# Patient Record
Sex: Female | Born: 1980 | Hispanic: Yes | Marital: Single | Smoking: Former smoker
Health system: Southern US, Community
[De-identification: ages and names within clinical notes are randomized; demographics above are authoritative.]

## PROBLEM LIST (undated history)

## (undated) ENCOUNTER — Inpatient Hospital Stay (HOSPITAL_COMMUNITY): Payer: Self-pay

## (undated) DIAGNOSIS — C9591 Leukemia, unspecified, in remission: Secondary | ICD-10-CM

## (undated) DIAGNOSIS — C801 Malignant (primary) neoplasm, unspecified: Secondary | ICD-10-CM

## (undated) DIAGNOSIS — K8020 Calculus of gallbladder without cholecystitis without obstruction: Secondary | ICD-10-CM

## (undated) DIAGNOSIS — N750 Cyst of Bartholin's gland: Secondary | ICD-10-CM

## (undated) HISTORY — PX: INCISION AND DRAINAGE: SHX5863

## (undated) HISTORY — DX: Leukemia, unspecified, in remission: C95.91

## (undated) HISTORY — PX: CHOLECYSTECTOMY: SHX55

## (undated) HISTORY — DX: Malignant (primary) neoplasm, unspecified: C80.1

---

## 2002-07-31 ENCOUNTER — Encounter: Payer: Self-pay | Admitting: Emergency Medicine

## 2002-07-31 ENCOUNTER — Emergency Department (HOSPITAL_COMMUNITY): Admission: EM | Admit: 2002-07-31 | Discharge: 2002-07-31 | Payer: Self-pay | Admitting: Emergency Medicine

## 2003-09-14 ENCOUNTER — Emergency Department (HOSPITAL_COMMUNITY): Admission: EM | Admit: 2003-09-14 | Discharge: 2003-09-14 | Payer: Self-pay | Admitting: Emergency Medicine

## 2005-06-09 ENCOUNTER — Emergency Department (HOSPITAL_COMMUNITY): Admission: EM | Admit: 2005-06-09 | Discharge: 2005-06-09 | Payer: Self-pay | Admitting: Emergency Medicine

## 2005-09-01 ENCOUNTER — Inpatient Hospital Stay (HOSPITAL_COMMUNITY): Admission: AD | Admit: 2005-09-01 | Discharge: 2005-09-01 | Payer: Self-pay | Admitting: Obstetrics & Gynecology

## 2005-09-16 ENCOUNTER — Inpatient Hospital Stay (HOSPITAL_COMMUNITY): Admission: AD | Admit: 2005-09-16 | Discharge: 2005-09-16 | Payer: Self-pay | Admitting: Gynecology

## 2005-09-30 ENCOUNTER — Inpatient Hospital Stay (HOSPITAL_COMMUNITY): Admission: AD | Admit: 2005-09-30 | Discharge: 2005-09-30 | Payer: Self-pay | Admitting: Obstetrics & Gynecology

## 2005-10-10 ENCOUNTER — Ambulatory Visit: Payer: Self-pay | Admitting: Obstetrics and Gynecology

## 2005-10-24 ENCOUNTER — Ambulatory Visit: Payer: Self-pay | Admitting: Obstetrics & Gynecology

## 2005-10-30 ENCOUNTER — Ambulatory Visit: Payer: Self-pay | Admitting: Gynecology

## 2005-10-31 ENCOUNTER — Ambulatory Visit: Payer: Self-pay | Admitting: Obstetrics & Gynecology

## 2005-11-13 ENCOUNTER — Ambulatory Visit: Payer: Self-pay | Admitting: Gynecology

## 2005-11-14 ENCOUNTER — Ambulatory Visit: Payer: Self-pay | Admitting: Obstetrics & Gynecology

## 2005-12-07 ENCOUNTER — Inpatient Hospital Stay (HOSPITAL_COMMUNITY): Admission: AD | Admit: 2005-12-07 | Discharge: 2005-12-07 | Payer: Self-pay | Admitting: Family Medicine

## 2009-03-25 HISTORY — PX: WISDOM TOOTH EXTRACTION: SHX21

## 2012-08-03 ENCOUNTER — Inpatient Hospital Stay (HOSPITAL_COMMUNITY): Payer: Self-pay

## 2012-08-03 ENCOUNTER — Encounter (HOSPITAL_COMMUNITY): Payer: Self-pay

## 2012-08-03 ENCOUNTER — Inpatient Hospital Stay (HOSPITAL_COMMUNITY)
Admission: AD | Admit: 2012-08-03 | Discharge: 2012-08-03 | Disposition: A | Discharge status: Home / Self Care | Payer: Self-pay | Source: Ambulatory Visit | Attending: Obstetrics & Gynecology | Admitting: Obstetrics & Gynecology

## 2012-08-03 DIAGNOSIS — A499 Bacterial infection, unspecified: Secondary | ICD-10-CM

## 2012-08-03 DIAGNOSIS — N760 Acute vaginitis: Secondary | ICD-10-CM | POA: Insufficient documentation

## 2012-08-03 DIAGNOSIS — N390 Urinary tract infection, site not specified: Secondary | ICD-10-CM | POA: Insufficient documentation

## 2012-08-03 DIAGNOSIS — O2341 Unspecified infection of urinary tract in pregnancy, first trimester: Secondary | ICD-10-CM

## 2012-08-03 DIAGNOSIS — O2390 Unspecified genitourinary tract infection in pregnancy, unspecified trimester: Secondary | ICD-10-CM | POA: Insufficient documentation

## 2012-08-03 DIAGNOSIS — R109 Unspecified abdominal pain: Secondary | ICD-10-CM | POA: Insufficient documentation

## 2012-08-03 DIAGNOSIS — B9689 Other specified bacterial agents as the cause of diseases classified elsewhere: Secondary | ICD-10-CM | POA: Insufficient documentation

## 2012-08-03 LAB — URINALYSIS, ROUTINE W REFLEX MICROSCOPIC
Bilirubin Urine: NEGATIVE
Glucose, UA: NEGATIVE mg/dL
Nitrite: POSITIVE — AB
Specific Gravity, Urine: 1.02 (ref 1.005–1.030)
pH: 6.5 (ref 5.0–8.0)

## 2012-08-03 LAB — CBC
Hemoglobin: 14.2 g/dL (ref 12.0–15.0)
Platelets: 251 10*3/uL (ref 150–400)
RBC: 4.89 MIL/uL (ref 3.87–5.11)
WBC: 12.3 10*3/uL — ABNORMAL HIGH (ref 4.0–10.5)

## 2012-08-03 LAB — URINE MICROSCOPIC-ADD ON

## 2012-08-03 LAB — WET PREP, GENITAL: Yeast Wet Prep HPF POC: NONE SEEN

## 2012-08-03 LAB — HCG, QUANTITATIVE, PREGNANCY: hCG, Beta Chain, Quant, S: 14874 m[IU]/mL — ABNORMAL HIGH (ref <5)

## 2012-08-03 MED ORDER — CEPHALEXIN 500 MG PO CAPS: 500.0000 mg | ORAL_CAPSULE | Freq: Three times a day (TID) | ORAL | Status: DC

## 2012-08-03 MED ORDER — METRONIDAZOLE 500 MG PO TABS: 500.0000 mg | ORAL_TABLET | Freq: Two times a day (BID) | ORAL | Status: DC

## 2012-08-03 NOTE — MAU Note (Signed)
Patient is in to verify that everything is still okay with her baby. She states that she had a miscarriage 6 years ago, she felt the same way. She denies vaginal bleeding or pain.

## 2012-08-03 NOTE — MAU Provider Note (Signed)
History     CSN: [id number]  Arrival date and time: 08/03/12 1138   First Provider Initiated Contact with Patient 08/03/12 1428      Chief Complaint  Patient presents with  . Abdominal Pain   HPI 32 y.o. G3P0010 at [redacted]w[redacted]d by LMP with cramping x about 1 week, no bleeding.   History reviewed. No pertinent past medical history.  Past Surgical History  Procedure Laterality Date  . Cholecystectomy      History reviewed. No pertinent family history.  History  Substance Use Topics  . Smoking status: Never Smoker   . Smokeless tobacco: Not on file  . Alcohol Use: No    Allergies: Allergies not on file  No prescriptions prior to admission    Review of Systems  Constitutional: Negative.   Respiratory: Negative.   Cardiovascular: Negative.   Gastrointestinal: Negative for nausea, vomiting, abdominal pain, diarrhea and constipation.  Genitourinary: Negative for dysuria, urgency, frequency, hematuria and flank pain.       Negative for vaginal bleeding, vaginal discharge, dyspareunia, + cramping   Musculoskeletal: Negative.   Neurological: Negative.   Psychiatric/Behavioral: Negative.    Physical Exam   Blood pressure 109/65, pulse 72, temperature 97.5 F (36.4 C), temperature source Oral, resp. rate 18, height 5' 1.5" (1.562 m), weight 144 lb (65.318 kg), last menstrual period 06/27/2012.  Physical Exam  Nursing note and vitals reviewed. Constitutional: She is oriented to person, place, and time. She appears well-developed and well-nourished. No distress.  Cardiovascular: Normal rate.   Respiratory: Effort normal.  GI: Soft. There is no tenderness.  Genitourinary:    There is no rash, tenderness or lesion on the right labia. There is no rash, tenderness or lesion on the left labia. No erythema, tenderness or bleeding around the vagina. Vaginal discharge: white.  Musculoskeletal: Normal range of motion.  Neurological: She is alert and oriented to person, place, and  time.  Skin: Skin is warm and dry.  Psychiatric: She has a normal mood and affect.    MAU Course  Procedures Results for orders placed during the hospital encounter of 08/03/12 (from the past 24 hour(s))  URINALYSIS, ROUTINE W REFLEX MICROSCOPIC     Status: Abnormal   Collection Time    08/03/12 12:05 PM      Result Value Range   Color, Urine YELLOW  YELLOW   APPearance HAZY (*) CLEAR   Specific Gravity, Urine 1.020  1.005 - 1.030   pH 6.5  5.0 - 8.0   Glucose, UA NEGATIVE  NEGATIVE mg/dL   Hgb urine dipstick TRACE (*) NEGATIVE   Bilirubin Urine NEGATIVE  NEGATIVE   Ketones, ur NEGATIVE  NEGATIVE mg/dL   Protein, ur NEGATIVE  NEGATIVE mg/dL   Urobilinogen, UA 0.2  0.0 - 1.0 mg/dL   Nitrite POSITIVE (*) NEGATIVE   Leukocytes, UA TRACE (*) NEGATIVE  URINE MICROSCOPIC-ADD ON     Status: Abnormal   Collection Time    08/03/12 12:05 PM      Result Value Range   Squamous Epithelial / LPF RARE  RARE   WBC, UA 3-6  <3 WBC/hpf   RBC / HPF 0-2  <3 RBC/hpf   Bacteria, UA MANY (*) RARE  POCT PREGNANCY, URINE     Status: Abnormal   Collection Time    08/03/12 12:12 PM      Result Value Range   Preg Test, Ur POSITIVE (*) NEGATIVE  CBC     Status: Abnormal   Collection Time  08/03/12  1:00 PM      Result Value Range   WBC 12.3 (*) 4.0 - 10.5 K/uL   RBC 4.89  3.87 - 5.11 MIL/uL   Hemoglobin 14.2  12.0 - 15.0 g/dL   HCT 16.1  09.6 - 04.5 %   MCV 85.1  78.0 - 100.0 fL   MCH 29.0  26.0 - 34.0 pg   MCHC 34.1  30.0 - 36.0 g/dL   RDW 40.9  81.1 - 91.4 %   Platelets 251  150 - 400 K/uL  HCG, QUANTITATIVE, PREGNANCY     Status: Abnormal   Collection Time    08/03/12  1:06 PM      Result Value Range   hCG, Beta Chain, Quant, S 78295 (*) <5 mIU/mL  ABO/RH     Status: None   Collection Time    08/03/12  1:09 PM      Result Value Range   ABO/RH(D) O POS     U/S: 5.2 week IUGS with yolk sac, no fetal pole seen yet, small subchorionic hemorrhage  Assessment and Plan   1.  Bacterial vaginosis   2. UTI in pregnancy, antepartum, first trimester   Meds prescribed as below, start care as soon as possible, pregnancy verification and list of providers given    Medication List    TAKE these medications       cephALEXin 500 MG capsule  Commonly known as:  KEFLEX  Take 1 capsule (500 mg total) by mouth 3 (three) times daily.     metroNIDAZOLE 500 MG tablet  Commonly known as:  FLAGYL  Take 1 tablet (500 mg total) by mouth 2 (two) times daily.            Follow-up Information   Follow up with provider of your choice. (start prenatal care as soon as possible)         FRAZIER,NATALIE 08/03/2012, 2:29 PM

## 2012-08-03 NOTE — MAU Note (Signed)
Has been having cramping, knows  She knows she is about 6 wks preg.  Has hx of SAB/ectopic; worried about it.

## 2012-08-05 LAB — URINE CULTURE

## 2012-08-05 LAB — GC/CHLAMYDIA PROBE AMP
CT Probe RNA: NEGATIVE
GC Probe RNA: NEGATIVE

## 2012-08-05 NOTE — MAU Provider Note (Signed)
Attestation of Attending Supervision of Advanced Practitioner (PA/CNM/NP): Evaluation and management procedures were performed by the Advanced Practitioner under my supervision and collaboration.  I have reviewed the Advanced Practitioner's note and chart, and I agree with the management and plan.  Jannelly Bergren, MD, FACOG Attending Obstetrician & Gynecologist Faculty Practice, [facility]  

## 2012-08-21 ENCOUNTER — Inpatient Hospital Stay (HOSPITAL_COMMUNITY)
Admission: AD | Admit: 2012-08-21 | Discharge: 2012-08-21 | Disposition: A | Discharge status: Home / Self Care | Payer: Self-pay | Source: Ambulatory Visit | Attending: Obstetrics & Gynecology | Admitting: Obstetrics & Gynecology

## 2012-08-21 ENCOUNTER — Encounter (HOSPITAL_COMMUNITY): Payer: Self-pay

## 2012-08-21 DIAGNOSIS — B3731 Acute candidiasis of vulva and vagina: Secondary | ICD-10-CM | POA: Insufficient documentation

## 2012-08-21 DIAGNOSIS — B373 Candidiasis of vulva and vagina: Secondary | ICD-10-CM | POA: Insufficient documentation

## 2012-08-21 DIAGNOSIS — O2390 Unspecified genitourinary tract infection in pregnancy, unspecified trimester: Secondary | ICD-10-CM | POA: Insufficient documentation

## 2012-08-21 DIAGNOSIS — N750 Cyst of Bartholin's gland: Secondary | ICD-10-CM | POA: Insufficient documentation

## 2012-08-21 HISTORY — DX: Calculus of gallbladder without cholecystitis without obstruction: K80.20

## 2012-08-21 HISTORY — DX: Cyst of Bartholin's gland: N75.0

## 2012-08-21 MED ORDER — OXYCODONE-ACETAMINOPHEN 5-325 MG PO TABS: 1.0000 | ORAL_TABLET | ORAL | Status: DC | PRN

## 2012-08-21 MED ORDER — FLUCONAZOLE 150 MG PO TABS
150.0000 mg | ORAL_TABLET | Freq: Once | ORAL | Status: AC
  Administered 2012-08-21: 150 mg via ORAL
  Filled 2012-08-21: qty 1

## 2012-08-21 NOTE — MAU Provider Note (Signed)
Chief Complaint: Bartholin's Cyst   First Provider Initiated Contact with Patient 08/21/12 1306     SUBJECTIVE HPI: Kelly [name] is a 32 y.o. G2P0010 at [redacted]w[redacted]d by LMP who presents with painful Bartholin's cyst which was diagnosed here 3 wks ago. Has gotten larger and has been painful for 2 days. Not draining. Similar episode 6 years ago required I&D and placement of catheter. Also has pruritic vulvo-vaginal white discharge and used Monistat last night.  Has NOB appointment with Dr. Gaynell Face 09/03/12. No further cramping. No vaginal bleeding.   Past Medical History  Diagnosis Date  . Bartholin cyst   . Gallstones    OB History   Grav Para Term Preterm Abortions TAB SAB Ect Mult Living   2    1  1         # Outc Date GA Lbr Len/2nd Wgt Sex Del Anes PTL Lv   1 SAB            2 CUR              Past Surgical History  Procedure Laterality Date  . Cholecystectomy     History   Social History  . Marital Status: Single    Spouse Name: N/A    Number of Children: N/A  . Years of Education: N/A   Occupational History  . Not on file.   Social History Main Topics  . Smoking status: Former Games developer  . Smokeless tobacco: Not on file  . Alcohol Use: No  . Drug Use: No  . Sexually Active: Yes   Other Topics Concern  . Not on file   Social History Narrative  . No narrative on file   No current facility-administered medications on file prior to encounter.   Current Outpatient Prescriptions on File Prior to Encounter  Medication Sig Dispense Refill  . cephALEXin (KEFLEX) 500 MG capsule Take 1 capsule (500 mg total) by mouth 3 (three) times daily.  21 capsule  0  . metroNIDAZOLE (FLAGYL) 500 MG tablet Take 1 tablet (500 mg total) by mouth 2 (two) times daily.  14 tablet  0   Allergies  Allergen Reactions  . Other     Medications that causes drowsiness causes tingling    ROS: Pertinent items in HPI  OBJECTIVE Blood pressure 117/68, pulse 84, temperature 98.4 F (36.9 C),  temperature source Oral, resp. rate 18, last menstrual period 06/27/2012. GENERAL: Well-developed, well-nourished female in no acute distress.  HEENT: Normocephalic HEART: normal rate RESP: normal effort ABDOMEN: Soft, non-tender EXTREMITIES: Nontender, no edema NEURO: Alert and oriented Ext genitalia: Left vulvar abcess 3.5 cm, very tender and somewhat fluctuant Vagina: Monistat cream evident  MAU COURSE Diflucan 150 mg given  Bartholin Cyst I&D and Ward Catheter Placement Enlarged abscess palpated in front of the hymenal ring around 5 or 7 o' clock.  Written informed consent was obtained.  Discussed complications and possible outcomes of procedure including recurrence of cyst, scarring leading to infecton, bleeding, dyspareunia, distortion of anatomy.  Patient was examined in the dorsal lithotomy position and mass was identified.  The area was prepped with Iodine and draped in a sterile manner. 1% Lidocaine (3 ml) was then used to infiltrate area on top of the cyst, behind the hymenal ring.  A 7 mm incision was made using a sterile scapel. Upon palpation of the mass, a small amount of bloody purulent drainage was expressed through the incision. A hemostat was used to attempt to break up loculations, but none  found and only bloody discharge. Did not place Word catheter (depth insufficieint). The open cyst was then irrigated with normal saline.  Patient tolerated the procedure well, reported feeling better.  - Recommended Sitz baths bid and Motrin. Offerred Percocet but patient declined. She was told to call to be examined if she experiences increasing swelling, pain, vaginal discharge, or fever.  She was instructed to wear a peripad to absorb discharge, and to maintain pelvic rest while the Word catheter is in place.    ASSESSMENT 1. Infected cyst of Bartholin's gland duct   2. Yeast vaginitis   G2P0010 at [redacted]w[redacted]d IUP  PLAN Discharge home See AVS for patient education   Medication List     TAKE these medications       oxyCODONE-acetaminophen 5-325 MG per tablet  Commonly known as:  PERCOCET/ROXICET  Take 1 tablet by mouth every 4 (four) hours as needed for pain.       Follow-up Information   Follow up with Kathreen Cosier, MD On 09/03/2012.   Contact information:   [address] [place] [address] [phone]       Danae Orleans, CNM 08/21/2012  1:15 PM

## 2012-08-21 NOTE — MAU Note (Signed)
Bartholin's cyst, was told it was there a few wks ago, has gotten worse, now is hurting.  Also thinks has an infection. (itching)

## 2012-08-22 NOTE — MAU Provider Note (Signed)
Attestation of Attending Supervision of Advanced Practitioner (CNM/NP): Evaluation and management procedures were performed by the Advanced Practitioner under my supervision and collaboration.  I have reviewed the Advanced Practitioner's note and chart, and I agree with the management and plan.  HARRAWAY-SMITH, Emelynn Rance 8:41 AM     

## 2012-08-24 ENCOUNTER — Encounter (HOSPITAL_COMMUNITY): Payer: Self-pay | Admitting: *Deleted

## 2012-08-24 ENCOUNTER — Inpatient Hospital Stay (HOSPITAL_COMMUNITY)
Admission: AD | Admit: 2012-08-24 | Discharge: 2012-08-24 | Disposition: A | Discharge status: Home / Self Care | Payer: Self-pay | Source: Ambulatory Visit | Attending: Obstetrics & Gynecology | Admitting: Obstetrics & Gynecology

## 2012-08-24 DIAGNOSIS — O2390 Unspecified genitourinary tract infection in pregnancy, unspecified trimester: Secondary | ICD-10-CM | POA: Insufficient documentation

## 2012-08-24 DIAGNOSIS — N750 Cyst of Bartholin's gland: Secondary | ICD-10-CM

## 2012-08-24 MED ORDER — LIDOCAINE 5 % EX OINT
TOPICAL_OINTMENT | Freq: Once | CUTANEOUS | Status: AC
  Administered 2012-08-24: 14:00:00 via TOPICAL
  Filled 2012-08-24: qty 35.44

## 2012-08-24 MED ORDER — HYDROCODONE-ACETAMINOPHEN 5-325 MG PO TABS
1.0000 | ORAL_TABLET | Freq: Once | ORAL | Status: AC
  Administered 2012-08-24: 1 via ORAL
  Filled 2012-08-24: qty 1

## 2012-08-24 MED ORDER — OXYCODONE-ACETAMINOPHEN 5-325 MG PO TABS: 1.0000 | ORAL_TABLET | Freq: Once | ORAL | Status: DC

## 2012-08-24 MED ORDER — CLINDAMYCIN HCL 300 MG PO CAPS: 300.0000 mg | ORAL_CAPSULE | Freq: Three times a day (TID) | ORAL | Status: DC

## 2012-08-24 MED ORDER — HYDROCODONE-ACETAMINOPHEN 5-325 MG PO TABS: 1.0000 | ORAL_TABLET | Freq: Four times a day (QID) | ORAL | Status: DC | PRN

## 2012-08-24 NOTE — MAU Provider Note (Signed)
Attestation of Attending Supervision of Advanced Practitioner (CNM/NP): Evaluation and management procedures were performed by the Advanced Practitioner under my supervision and collaboration. I have reviewed the Advanced Practitioner's note and chart, and I agree with the management and plan.  LEGGETT,KELLY H. 7:04 PM

## 2012-08-24 NOTE — MAU Provider Note (Signed)
History     CSN: [id number]  Arrival date and time: 08/24/12 1251   First Provider Initiated Contact with Patient 08/24/12 1342      No chief complaint on file.  HPI 32 y.o. G2P0010 at [redacted]w[redacted]d with bartholin's cyst. I&D on Friday without much drainage, worsened on Friday night again, worsened over weekend, doing sitz baths.   Past Medical History  Diagnosis Date  . Bartholin cyst   . Gallstones     Past Surgical History  Procedure Laterality Date  . Cholecystectomy      No family history on file.  History  Substance Use Topics  . Smoking status: Former Games developer  . Smokeless tobacco: Not on file  . Alcohol Use: No    Allergies:  Allergies  Allergen Reactions  . Other     Medications that causes drowsiness causes tingling    Prescriptions prior to admission  Medication Sig Dispense Refill  . ibuprofen (ADVIL,MOTRIN) 800 MG tablet Take 800 mg by mouth every 8 (eight) hours as needed for pain.      . Prenatal Vit-Fe Fumarate-FA (PRENATAL MULTIVITAMIN) TABS Take 1 tablet by mouth daily at 12 noon.        Review of Systems  Constitutional: Negative.   Respiratory: Negative.   Cardiovascular: Negative.   Gastrointestinal: Negative for nausea, vomiting, abdominal pain, diarrhea and constipation.  Genitourinary: Negative for dysuria, urgency, frequency, hematuria and flank pain.       Negative for vaginal bleeding, vaginal discharge  Musculoskeletal: Negative.   Neurological: Negative.   Psychiatric/Behavioral: Negative.    Physical Exam   Blood pressure 104/64, pulse 84, temperature 98.2 F (36.8 C), temperature source Oral, resp. rate 18, last menstrual period 06/27/2012.  Physical Exam  Nursing note and vitals reviewed. Constitutional: She is oriented to person, place, and time. She appears well-developed and well-nourished. No distress.  Cardiovascular: Normal rate.   Respiratory: Effort normal.  Musculoskeletal: Normal range of motion.  Neurological: She is  alert and oriented to person, place, and time.  Skin: Skin is warm and dry.  Psychiatric: She has a normal mood and affect.    MAU Course  INCISION AND DRAINAGE Date/Time: 08/24/2012 4:20 PM Performed by: Archie Patten Authorized by: Archie Patten Consent: Verbal consent obtained. written consent obtained. Risks and benefits: risks, benefits and alternatives were discussed Consent given by: patient Required items: required blood products, implants, devices, and special equipment available Patient identity confirmed: verbally with patient Type: abscess Location: bartholin's gland. Local anesthetic: topical anesthetic Patient sedated: no Incision type: single straight Complexity: simple Drainage: purulent and serosanguinous Drainage amount: copious Wound treatment: wound left open (attempted to place word catheter unsuccessully) Patient tolerance: Patient tolerated the procedure well with no immediate complications.     Assessment and Plan   1. Bartholin's cyst   Sitz baths, rx clindamycin and vicodin, has appt scheduled with Dr. Gaynell Face on 6/12, precautions rev'd    Medication List    TAKE these medications       clindamycin 300 MG capsule  Commonly known as:  CLEOCIN  Take 1 capsule (300 mg total) by mouth 3 (three) times daily.     HYDROcodone-acetaminophen 5-325 MG per tablet  Commonly known as:  NORCO/VICODIN  Take 1 tablet by mouth every 6 (six) hours as needed for pain.     ibuprofen 800 MG tablet  Commonly known as:  ADVIL,MOTRIN  Take 800 mg by mouth every 8 (eight) hours as needed for pain.  prenatal multivitamin Tabs  Take 1 tablet by mouth daily at 12 noon.            Follow-up Information   Follow up with MARSHALL,BERNARD A, MD. (as scheduled)    Contact information:   [address] [place] [address] [phone]         Yanky Vanderburg 08/24/2012, 1:44 PM

## 2012-08-24 NOTE — MAU Note (Signed)
Pt reports she was seen on Friday for Barthelens cyst on left labia. I&D done but it "was not ready" Has reformed and is much more painful now. Did warm baths without much releif.

## 2012-08-24 NOTE — Progress Notes (Signed)
Moderate amount of purulent drainage out . Pt tolerated fair to well

## 2012-11-01 ENCOUNTER — Inpatient Hospital Stay (HOSPITAL_COMMUNITY)
Admission: AD | Admit: 2012-11-01 | Discharge: 2012-11-01 | Disposition: A | Discharge status: Home / Self Care | Payer: Self-pay | Source: Ambulatory Visit | Attending: Obstetrics and Gynecology | Admitting: Obstetrics and Gynecology

## 2012-11-01 ENCOUNTER — Telehealth (HOSPITAL_COMMUNITY): Payer: Self-pay | Admitting: *Deleted

## 2012-11-01 ENCOUNTER — Encounter (HOSPITAL_COMMUNITY): Payer: Self-pay | Admitting: *Deleted

## 2012-11-01 DIAGNOSIS — N750 Cyst of Bartholin's gland: Secondary | ICD-10-CM

## 2012-11-01 DIAGNOSIS — N751 Abscess of Bartholin's gland: Secondary | ICD-10-CM | POA: Insufficient documentation

## 2012-11-01 MED ORDER — CEPHALEXIN 500 MG PO CAPS: 500.0000 mg | ORAL_CAPSULE | Freq: Four times a day (QID) | ORAL | Status: DC

## 2012-11-01 MED ORDER — HYDROCODONE-ACETAMINOPHEN 5-325 MG PO TABS: 1.0000 | ORAL_TABLET | Freq: Four times a day (QID) | ORAL | Status: DC | PRN

## 2012-11-01 MED ORDER — LIDOCAINE HCL 2 % EX GEL
Freq: Once | CUTANEOUS | Status: AC
  Administered 2012-11-01: 5 via TOPICAL
  Filled 2012-11-01: qty 20

## 2012-11-01 MED ORDER — HYDROCODONE-ACETAMINOPHEN 5-325 MG PO TABS
1.0000 | ORAL_TABLET | Freq: Once | ORAL | Status: AC
  Administered 2012-11-01: 1 via ORAL
  Filled 2012-11-01: qty 1

## 2012-11-01 MED ORDER — LIDOCAINE HCL (PF) 1 % IJ SOLN
INTRAMUSCULAR | Status: AC
  Administered 2012-11-01: 30 mL
  Filled 2012-11-01: qty 30

## 2012-11-01 MED ORDER — CEPHALEXIN 500 MG PO CAPS
500.0000 mg | ORAL_CAPSULE | Freq: Once | ORAL | Status: DC
  Filled 2012-11-01: qty 1

## 2012-11-01 NOTE — Discharge Instructions (Signed)
Bartholin's Cyst and Abscess Bartholin's glands produce mucus through small openings just outside the opening of the vagina. The mucus helps with lubrication around the vagina during sexual intercourse. If the duct becomes clogged, the gland will swell and cause a bulge on the inside of the vagina. If this becomes big enough, it can be seen and felt on the outside of the vagina as well. Sometimes, the swelling will shrink away by itself. However, if the cyst becomes infected, the Bartholin's cyst fills with pus and becomes more swollen, red and painful and becomes a Bartholin's abscess. This usually requires antibiotic treatment and surgical drainage. Sometimes, with minor surgery under local anesthesia, a small tube is placed in the cyst or abscess wall. This allows continued drainage for up to 6 weeks. Minor surgery can make a new opening to replace the clogged duct and help prevent future cysts or abscess. If the abscess occurs several times, a minor operation with local anesthesia is necessary to remove the Bartholin's gland completely or to make it drain better. Cutting open the gland and suturing the edges to make the opening of the gland bigger (marsupialization) may be needed and should usually be done by your obstetrician-gyncology physician. Antibiotics are usually prescribed for this condition. Take all antibiotics as prescribed. Make sure to finish them even if you are doing better. Take warm sitz baths for 20 minutes, 3 times a day. See your caregiver for follow-up care as recommended. SEEK MEDICAL CARE IF:   You have increasing pain, swelling, or redness near the vagina.  You have vomiting or inability to tolerate medicines.  You have a fever. Bartholin's Cyst or Abscess Bartholin's glands are small glands located within the folds of skin (labia) along the sides of the lower opening of the vagina (birth canal). A cyst may develop when the duct of the gland becomes blocked. When this happens,  fluid that accumulates within the cyst can become infected. This is known as an abscess. The Bartholin gland produces a mucous fluid to lubricate the outside of the vagina during sexual intercourse. SYMPTOMS  Patients with a small cyst may not have any symptoms. Mild discomfort to severe pain depending on the size of the cyst and if it is infected (abscess). Pain, redness, and swelling around the lower opening of the vagina. Painful intercourse. Pressure in the perineal area. Swelling of the lips of the vagina (labia). The cyst or abscess can be on one side or both sides of the vagina. DIAGNOSIS  A large swelling is seen in the lower vagina area by your caregiver. Painful to touch. Redness and pain, if it is an abscess. TREATMENT  Sometimes the cyst will go away on its own. Apply warm wet compresses to the area or take hot sitz baths several times a day. An incision to drain the cyst or abscess with local anesthesia. Culture the pus, if it is an abscess. Antibiotic treatment, if it is an abscess. Cut open the gland and suture the edges to make the opening of the gland bigger (marsupialization). Remove the whole gland if the cyst or abscess returns. PREVENTION  Practice good hygiene. Clean the vaginal area with a mild soap and soft cloth when bathing. Do not rub hard in the vaginal area when bathing. Protect the crotch area with a padded cushion if you take long bike rides or ride horses. Be sure you are well lubricated when you have sexual intercourse. HOME CARE INSTRUCTIONS  If your cyst or abscess was opened,  a small piece of gauze, or a drain, may have been placed in the wound to allow drainage. Do not remove this gauze or drain unless directed by your caregiver. Wear feminine pads, not tampons, as needed for any drainage or bleeding. If antibiotics were prescribed, take them exactly as directed. Finish the entire course. Only take over-the-counter or prescription medicines for  pain, discomfort, or fever as directed by your caregiver. SEEK IMMEDIATE MEDICAL CARE IF:  You have an increase in pain, redness, swelling, or drainage. You have bleeding from the wound which results in the use of more than the number of pads suggested by your caregiver in 24 hours. You have chills. You have a fever. You develop any new problems (symptoms) or aggravation of your existing condition. MAKE SURE YOU:  Understand these instructions. Will watch your condition. Will get help right away if you are not doing well or get worse. Document Released: 03/11/2005 Document Revised: 06/03/2011 Document Reviewed: 10/28/2007 [facility] Patient Information 2014 [place], [place].   You have uncontrolled bleeding from the vagina. Document Released: 04/18/2004 Document Revised: 06/03/2011 Document Reviewed: 04/21/2009 [facility] Patient Information 2014 [place], [place].

## 2012-11-01 NOTE — MAU Note (Addendum)
Pt reports she started having symptoms 6 days ago. Cyst on left side. Has been soaking and took antibiotics (she had at home)(amoxacillian). Using lidocaine cream she got ths last time she had one as well.  Pt has had it 2 other times. Last one in MAY. Pt had miscarriage in June.

## 2012-11-01 NOTE — MAU Provider Note (Signed)
History     CSN: [id number]  Arrival date and time: 11/01/12 1248   First Provider Initiated Contact with Patient 11/01/12 1404      Chief Complaint  Patient presents with  . Bartholin's Cyst   HPI  Kelly [name] 32 y.o.non pregnant female presents to MAU with complaints of a suspected bartholin's cyst. Symptoms started 6 days ago where she felt a marble size lesion on her left labia. She tried doing warm water soaks, lidocaine jelly that was left over from her previous bartholin's cyst and has taken amoxicillin that was left over from a previous URI. The cyst is now golf ball size and the patient rates her pain an 8/10. She had a bartholin's cyst drained in May here in MAU; they attempted to insert the ward catheter however she did not tolerate it. Sh has had multiple bartholin's cyst in the past year.   OB History   Grav Para Term Preterm Abortions TAB SAB Ect Mult Living   2    1  1    0      Past Medical History  Diagnosis Date  . Bartholin cyst   . Gallstones     Past Surgical History  Procedure Laterality Date  . Cholecystectomy      History reviewed. No pertinent family history.  History  Substance Use Topics  . Smoking status: Former Games developer  . Smokeless tobacco: Not on file  . Alcohol Use: No    Allergies:  Allergies  Allergen Reactions  . Other     Medications that causes drowsiness causes tingling    Prescriptions prior to admission  Medication Sig Dispense Refill  . amoxicillin (AMOXIL) 500 MG capsule Take 500 mg by mouth every 8 (eight) hours.      Marland Kitchen ibuprofen (ADVIL,MOTRIN) 200 MG tablet Take 600 mg by mouth every 6 (six) hours as needed for pain.      Marland Kitchen OVER THE COUNTER MEDICATION Take 1 tablet by mouth daily. Over the counter all natural multi vitamin supplement.        ROS Physical Exam   Blood pressure 126/75, pulse 110, temperature 97.8 F (36.6 C), temperature source Oral, resp. rate 18, height 5' (1.524 m), last menstrual period  06/27/2012, unknown if currently breastfeeding.  Physical Exam Physical Examination: General appearance - oriented to person, place, and time, anxious and in mild to moderate distress Pelvic - VULVA: vulvar tenderness L, vulvar edema L, vulvar mass L, Prior site of I& d noted.  MAU Course  Procedures Bartholin Cyst I&D and Ward Catheter Placement  jvferguson Enlarged abscess palpated in front of the hymenal ring around 5 o' clock.  Written informed consent was obtained.  Discussed complications and possible outcomes of procedure including recurrence of cyst, scarring leading to infecton, bleeding, dyspareunia, distortion of anatomy.  Patient was examined in the dorsal lithotomy position and mass was identified.  The area was prepped with Iodine and draped in a sterile manner. 1% Lidocaine (2 ml) was then used to infiltrate area on top of the cyst, behind the hymenal ring.  A 4 mm incision was made using a sterile scapel , an L-shaped incision. Upon palpation of the mass, a moderate amount of bloody purulent drainage was expressed through the incision. A hemostat was used to break up loculations, which resulted in expression of more bloody purulent drainage.   a Word catheter was placed. 1.0 ml of sterile water was used to inflate the catheter balloon.  The end of the catheter  was tucked into the vagina.  Patient tolerated the procedure well, reported feeling " a lot better." - Keflex 500 qid x 7 days for treatment - Recommended Sitz baths bid and Motrin and Percocet was given  prn pain.   She was told to call to be examined if she experiences increasing swelling, pain, vaginal discharge, or fever.  - She was instructed to wear a peripad to absorb discharge, and to maintain pelvic rest while the Word catheter is in place.  -The catheter will be left in place for at least two to four weeks to promote formation of an epithelialized tract for permanent drainage of glandular secretions.  - She will need an  appointment in GYN Clinic , or provider of choice  in 4 weeks from now for removal of word catheter.   MDM   Assessment and Plan  Left Bartholins Abscess.  SEE procedure note  RASCH, JENNIFER IRENE 11/01/2012, 2:15 PM

## 2012-11-01 NOTE — MAU Note (Signed)
Pt. Here for bartholin cyst symptoms for 6 days and is the size of a golf ball. Pt. States it is on the left side and has been taking amoxicillin for 3 days and took last pill today at 0800. Has been using lidocaine cream she got  In May with her last cyst. Did have last one drained. Pt. Also states she had a miscarriage in May 2014.

## 2012-11-12 ENCOUNTER — Encounter: Payer: Self-pay | Admitting: Obstetrics & Gynecology

## 2012-12-02 ENCOUNTER — Encounter: Payer: Self-pay | Admitting: Obstetrics & Gynecology

## 2012-12-02 ENCOUNTER — Ambulatory Visit (INDEPENDENT_AMBULATORY_CARE_PROVIDER_SITE_OTHER): Payer: Self-pay | Admitting: Obstetrics & Gynecology

## 2012-12-02 VITALS — BP 98/67 | HR 86 | Temp 98.2°F | Resp 20 | Ht 60.0 in | Wt 151.1 lb

## 2012-12-02 DIAGNOSIS — N751 Abscess of Bartholin's gland: Secondary | ICD-10-CM

## 2012-12-02 NOTE — Progress Notes (Signed)
Pt has some vaginal itching and creamy, yellow d/c.

## 2012-12-02 NOTE — Progress Notes (Signed)
  Subjective:    Patient ID: Kelly [name], female    DOB: Mar 07, 1981, 32 y.o.   MRN: [id number]  HPI  She is here to have her Word catheter removed. It was placed in the MAU about 4 weeks ago. She denies any pain or other problems.   Review of Systems  She uses condoms for birth control.  Her pap smear is due.    Objective:   Physical Exam  Word catheter removed easily. Opening now re epithelialized. Moderate amount of greenish liquid came out after the catheter.      Assessment & Plan:  RTC 2 months for annual

## 2012-12-02 NOTE — Patient Instructions (Signed)
Bartholin's Cyst and Abscess  Bartholin's glands produce mucus through small openings just outside the opening of the vagina. The mucus helps with lubrication around the vagina during sexual intercourse. If the duct becomes clogged, the gland will swell and cause a bulge on the inside of the vagina. If this becomes big enough, it can be seen and felt on the outside of the vagina as well. Sometimes, the swelling will shrink away by itself. However, if the cyst becomes infected, the Bartholin's cyst fills with pus and becomes more swollen, red and painful and becomes a Bartholin's abscess. This usually requires antibiotic treatment and surgical drainage. Sometimes, with minor surgery under local anesthesia, a small tube is placed in the cyst or abscess wall. This allows continued drainage for up to 6 weeks. Minor surgery can make a new opening to replace the clogged duct and help prevent future cysts or abscess.  If the abscess occurs several times, a minor operation with local anesthesia is necessary to remove the Bartholin's gland completely or to make it drain better. Cutting open the gland and suturing the edges to make the opening of the gland bigger (marsupialization) may be needed and should usually be done by your obstetrician-gyncology physician. Antibiotics are usually prescribed for this condition. Take all antibiotics as prescribed. Make sure to finish them even if you are doing better. Take warm sitz baths for 20 minutes, 3 times a day. See your caregiver for follow-up care as recommended.  SEEK MEDICAL CARE IF:    You have increasing pain, swelling, or redness near the vagina.   You have vomiting or inability to tolerate medicines.   You have a fever.   You have uncontrolled bleeding from the vagina.  Document Released: 04/18/2004 Document Revised: 06/03/2011 Document Reviewed: 04/21/2009  [employer] Patient Information 2014 [employer].

## 2013-07-14 ENCOUNTER — Ambulatory Visit: Payer: Self-pay | Admitting: Family Medicine

## 2013-07-14 ENCOUNTER — Ambulatory Visit: Payer: Self-pay

## 2013-07-14 VITALS — BP 99/66 | HR 75 | Temp 98.4°F | Resp 18 | Wt 139.0 lb

## 2013-07-14 DIAGNOSIS — R1032 Left lower quadrant pain: Secondary | ICD-10-CM

## 2013-07-14 DIAGNOSIS — D696 Thrombocytopenia, unspecified: Secondary | ICD-10-CM

## 2013-07-14 DIAGNOSIS — D72829 Elevated white blood cell count, unspecified: Secondary | ICD-10-CM

## 2013-07-14 DIAGNOSIS — R21 Rash and other nonspecific skin eruption: Secondary | ICD-10-CM

## 2013-07-14 DIAGNOSIS — R109 Unspecified abdominal pain: Secondary | ICD-10-CM

## 2013-07-14 LAB — POCT CBC
Granulocyte percent: 10.1 % — AB (ref 37–80)
HCT, POC: 40 % (ref 37.7–47.9)
Hemoglobin: 13.4 g/dL (ref 12.2–16.2)
Lymph, poc: 15.2 — AB (ref 0.6–3.4)
MCH, POC: 28.3 pg (ref 27–31.2)
MCHC: 33.5 g/dL (ref 31.8–35.4)
MCV: 84.3 fL (ref 80–97)
MID (cbc): 6.7 — AB (ref 0–0.9)
POC Granulocyte: 2.5 (ref 2–6.9)
POC LYMPH PERCENT: 62.4 % — AB (ref 10–50)
POC MID %: 27.5 %M — AB (ref 0–12)
Platelet Count, POC: 33 10*3/uL — AB (ref 142–424)
RBC: 4.74 M/uL (ref 4.04–5.48)
RDW, POC: 26.2 %
WBC: 24.3 10*3/uL — AB (ref 4.6–10.2)

## 2013-07-14 LAB — POCT URINALYSIS DIPSTICK
Bilirubin, UA: NEGATIVE
Glucose, UA: NEGATIVE
Ketones, UA: NEGATIVE
Leukocytes, UA: NEGATIVE
Nitrite, UA: POSITIVE
Protein, UA: NEGATIVE
Spec Grav, UA: 1.01
Urobilinogen, UA: 2
pH, UA: 7

## 2013-07-14 LAB — POCT UA - MICROSCOPIC ONLY
Casts, Ur, LPF, POC: NEGATIVE
Crystals, Ur, HPF, POC: NEGATIVE
Mucus, UA: NEGATIVE
WBC, Ur, HPF, POC: NEGATIVE
Yeast, UA: NEGATIVE

## 2013-07-14 NOTE — Progress Notes (Signed)
Chief Complaint:  Chief Complaint  Patient presents with  . Abdominal Pain    bloating  . Emesis  . Rash    face    HPI: Kelly [name] is a 33 y.o. female who is here for  2 day history of diffuse middle and left sided abdominal pain associated with 5 episodes of yellow, nonbloody emesis . She denies CP, SOB , nausea, diarrhea.  Does have hx of constipation, last BM was yesterday.  Hx of cholecystectomy 13 years. No distension , + flatus. No hx of SBO. No new travels, No new medicines. No new meds in last 2 days. About 3-4 weeks ago, However,  she had tooth pain and sinus congestion sxs and did take  claritin and also amoxacillin about 1 month ago, the amoxacillin was left over from some other illness. Her leg rash occurred after taking the amoxacillin.  Recently She has had sick contact, her coworker who has had similar abdominal and vomiting sxs.  Denies any night sweats, or swollen glands. She has lost weight since her first illness, she has lost about 10 lbs . Denies immunocompromise, denies urinary symptoms Her stomach was really sore yesterday, ok today, she denies any emesis today. Admits to some gingival bleeding.  She started having rash on her face yesterday after she threw up, she had similar ssxs with vomiting and rash on her legs after  What sounds like viral syndrome 4 weeks ago where she ahd some nonbloody diarrhea . She last 10 lbs since everything was tastlesss but she ahs been able to eat since then.  She is a Educational psychologist. She is a smoker 2-3 cigs daily    Past Medical History  Diagnosis Date  . Bartholin cyst   . Gallstones    Past Surgical History  Procedure Laterality Date  . Cholecystectomy    . Wisdom tooth extraction  2011   History   Social History  . Marital Status: Single    Spouse Name: N/A    Number of Children: N/A  . Years of Education: N/A   Social History Main Topics  . Smoking status: Current Every Day Smoker -- 0.25 packs/day  .  Smokeless tobacco: None  . Alcohol Use: No  . Drug Use: No  . Sexual Activity: Yes    Birth Control/ Protection: Condom   Other Topics Concern  . None   Social History Narrative  . None   Family History  Problem Relation Age of Onset  . Hyperlipidemia Mother   . Hypertension Mother   . Diabetes Mother   . Diabetes Father   . Aneurysm Father     aortic  . Cholelithiasis Sister   . Cholelithiasis Sister   . Hyperlipidemia Sister    Allergies  Allergen Reactions  . Other     Medications that causes drowsiness causes tingling   Prior to Admission medications   Medication Sig Start Date End Date Taking? Authorizing Provider  HYDROcodone-acetaminophen (NORCO/VICODIN) 5-325 MG per tablet Take 1 tablet by mouth every 6 (six) hours as needed for pain. 11/01/12   Ronnald Ramp, NP     ROS: The patient denies fevers, chills, night sweats, chest pain, palpitations, wheezing, dyspnea on exertion, dysuria, hematuria, melena, numbness, weakness, or tingling.   All other systems have been reviewed and were otherwise negative with the exception of those mentioned in the HPI and as above.    PHYSICAL EXAM: Filed Vitals:   07/14/13 1246  BP: 99/66  Pulse: 75  Temp: 98.4 F (36.9 C)  Resp: 18   Filed Vitals:   07/14/13 1246  Weight: 139 lb (63.05 kg)   Body mass index is 27.15 kg/(m^2).  General: Alert, no acute distress HEENT:  Normocephalic, atraumatic, oropharynx patent. EOMI, PERRLA, no thryoidmegaly Cardiovascular:  Regular rate and rhythm, no rubs murmurs or gallops.  No Carotid bruits, radial pulse intact. No pedal edema.  Respiratory: Clear to auscultation bilaterally.  No wheezes, rales, or rhonchi.  No cyanosis, no use of accessory musculature GI: No organomegaly, abdomen is soft and middle and left abd tenderness, positive bowel sounds.  No splenomegaly. Skin: + petechial rashes. Neurologic: Facial musculature symmetric. Psychiatric: Patient is appropriate  throughout our interaction. Lymphatic: No cervical lymphadenopathy Musculoskeletal: Gait intact.   LABS: Results for orders placed in visit on 07/14/13  POCT CBC      Result Value Ref Range   WBC 24.3 (*) 4.6 - 10.2 K/uL   Lymph, poc 15.2 (*) 0.6 - 3.4   POC LYMPH PERCENT 62.4 (*) 10 - 50 %L   MID (cbc) 6.7 (*) 0 - 0.9   POC MID % 27.5 (*) 0 - 12 %M   POC Granulocyte 2.5  2 - 6.9   Granulocyte percent 10.1 (*) 37 - 80 %G   RBC 4.74  4.04 - 5.48 M/uL   Hemoglobin 13.4  12.2 - 16.2 g/dL   HCT, POC 40.0  37.7 - 47.9 %   MCV 84.3  80 - 97 fL   MCH, POC 28.3  27 - 31.2 pg   MCHC 33.5  31.8 - 35.4 g/dL   RDW, POC 26.2     Platelet Count, POC 33 (*) 142 - 424 K/uL   MPV    0 - 99.8 fL  POCT UA - MICROSCOPIC ONLY      Result Value Ref Range   WBC, Ur, HPF, POC neg     RBC, urine, microscopic 0-2     Bacteria, U Microscopic 3+     Mucus, UA neg     Epithelial cells, urine per micros 0-3     Crystals, Ur, HPF, POC neg     Casts, Ur, LPF, POC neg     Yeast, UA neg    POCT URINALYSIS DIPSTICK      Result Value Ref Range   Color, UA yellow     Clarity, UA cloudy     Glucose, UA neg     Bilirubin, UA neg     Ketones, UA neg     Spec Grav, UA 1.010     Blood, UA small     pH, UA 7.0     Protein, UA neg     Urobilinogen, UA 2.0     Nitrite, UA pos     Leukocytes, UA Negative       EKG/XRAY:   Primary read interpreted by Dr. Marin Comment at [facility]. ? Hepatomegaly and also spenomegaly   ASSESSMENT/PLAN: Encounter Diagnoses  Name Primary?  . Abdominal pain, left lower quadrant Yes  . Rash and nonspecific skin eruption   . Thrombocytopenia, unspecified   . Leukocytosis, unspecified    Kelly [name] is a pleasant woman from [place] who is here with a complaint of left sided abd pain with emesis for the last 2 days. She was found to have abnormal blood work concerning for possible:  Acute leukemia vs Idiopathic Thrombocytopenia Purpura  vs post-viral/bacterial infection CBC was repeated x  3, similar results each time.  Kelly [name] looks pretty healthy except for petechial like rash on face and also resolving one on bilateral lower legs. I explained to Kelly. Emiliano [name] was told that I do not know why her blood work is abnormal, it could be stemming from one thing or could be multifactorial, since her blood work was significantly abnormal, I would like her to go to ER for further evaluation. The concern is this might be an acute leukemia ie ALL/CLL or AML and if so she needs to be evaluated.  CMP pending, + nitrites on urine analysis and + hepatomegaly on xray.  Spoke with Dr. Juliann Mule with heme-onc and he recommended further eval at [facility] if concerning for acute leukemia of various etiology Called [facility] to notify patient is enroute via private transportation F/u prn   Gross sideeffects, risk and benefits, and alternatives of medications d/w patient. Patient is aware that all medications have potential sideeffects and we are unable to predict every sideeffect or drug-drug interaction that may occur.  Glenford Bayley, DO 07/14/2013 2:51 PM

## 2013-07-15 ENCOUNTER — Telehealth: Payer: Self-pay | Admitting: Family Medicine

## 2013-07-15 LAB — COMPREHENSIVE METABOLIC PANEL
ALT: 24 U/L (ref 0–35)
AST: 22 U/L (ref 0–37)
Alkaline Phosphatase: 68 U/L (ref 39–117)
CO2: 22 mEq/L (ref 19–32)
Sodium: 138 mEq/L (ref 135–145)
Total Bilirubin: 3.8 mg/dL — ABNORMAL HIGH (ref 0.2–1.2)
Total Protein: 7.9 g/dL (ref 6.0–8.3)

## 2013-07-15 LAB — COMPREHENSIVE METABOLIC PANEL WITH GFR
Albumin: 4.8 g/dL (ref 3.5–5.2)
BUN: 10 mg/dL (ref 6–23)
Calcium: 9.1 mg/dL (ref 8.4–10.5)
Chloride: 101 meq/L (ref 96–112)
Creat: 0.63 mg/dL (ref 0.50–1.10)
Glucose, Bld: 78 mg/dL (ref 70–99)
Potassium: 3.2 meq/L — ABNORMAL LOW (ref 3.5–5.3)

## 2013-07-15 NOTE — Telephone Encounter (Signed)
LM to call us back. I would like an update, also wanted to let her know about labs.

## 2013-07-16 DIAGNOSIS — C9201 Acute myeloblastic leukemia, in remission: Secondary | ICD-10-CM | POA: Insufficient documentation

## 2013-09-01 DIAGNOSIS — N750 Cyst of Bartholin's gland: Secondary | ICD-10-CM | POA: Insufficient documentation

## 2013-09-01 DIAGNOSIS — Z86718 Personal history of other venous thrombosis and embolism: Secondary | ICD-10-CM | POA: Insufficient documentation

## 2013-09-01 DIAGNOSIS — Z8679 Personal history of other diseases of the circulatory system: Secondary | ICD-10-CM | POA: Insufficient documentation

## 2013-11-01 DIAGNOSIS — T829XXA Unspecified complication of cardiac and vascular prosthetic device, implant and graft, initial encounter: Secondary | ICD-10-CM | POA: Insufficient documentation

## 2014-08-19 IMAGING — CR DG ABDOMEN ACUTE W/ 1V CHEST
3 series · 3 of 3 positions shown · non-contrast
Comparison: DG CHEST 2 VIEW dated 07/31/2002

CLINICAL DATA: Abdominal pain.

EXAM:
ACUTE ABDOMEN SERIES (ABDOMEN 2 VIEW & CHEST 1 VIEW)

[PA]
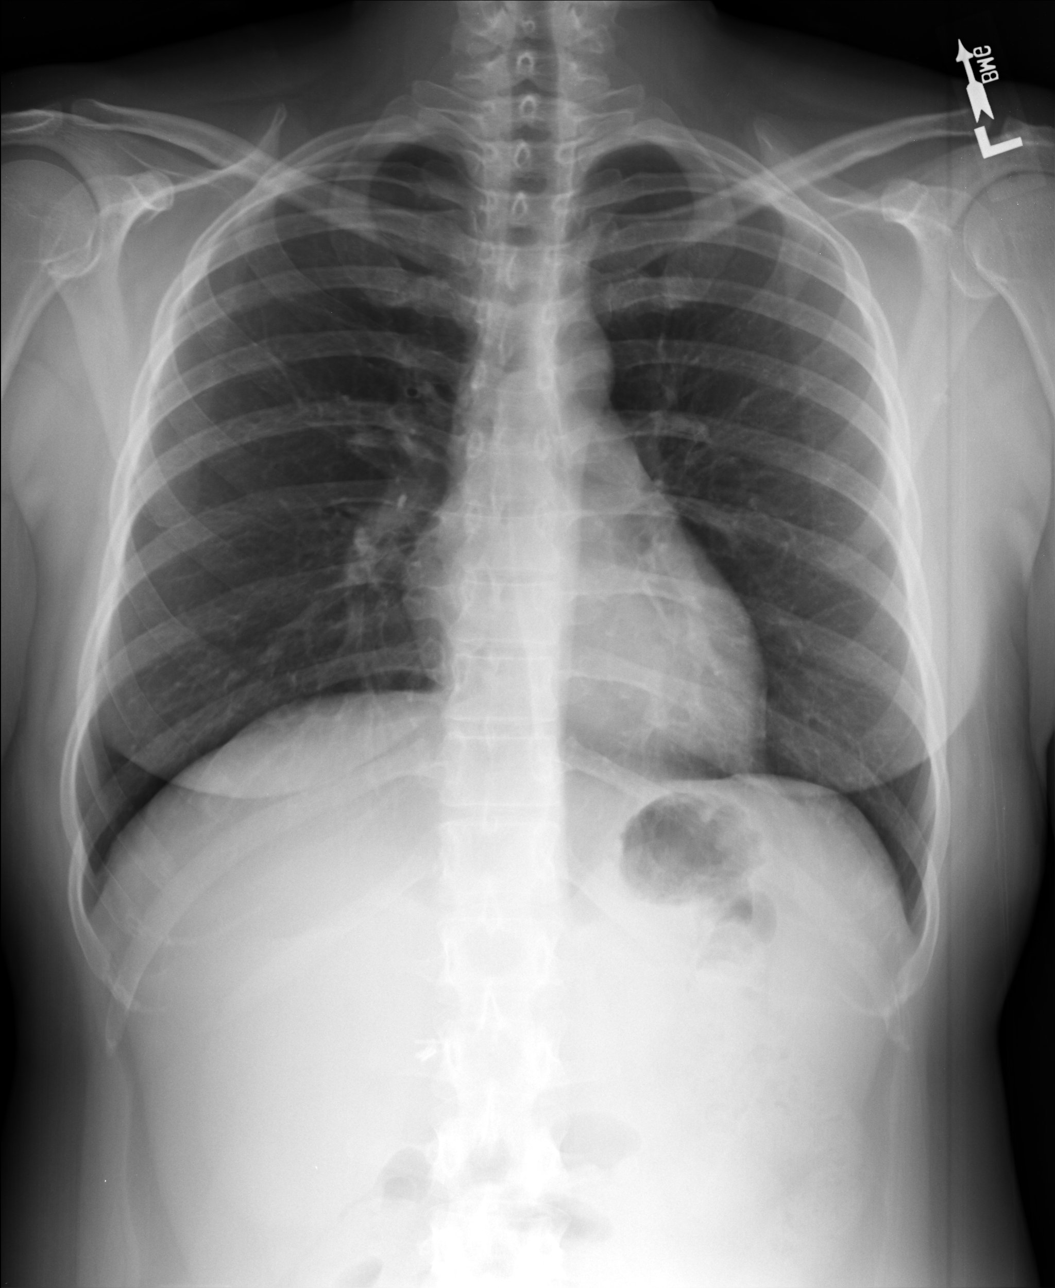

[AP (1 of 2)]
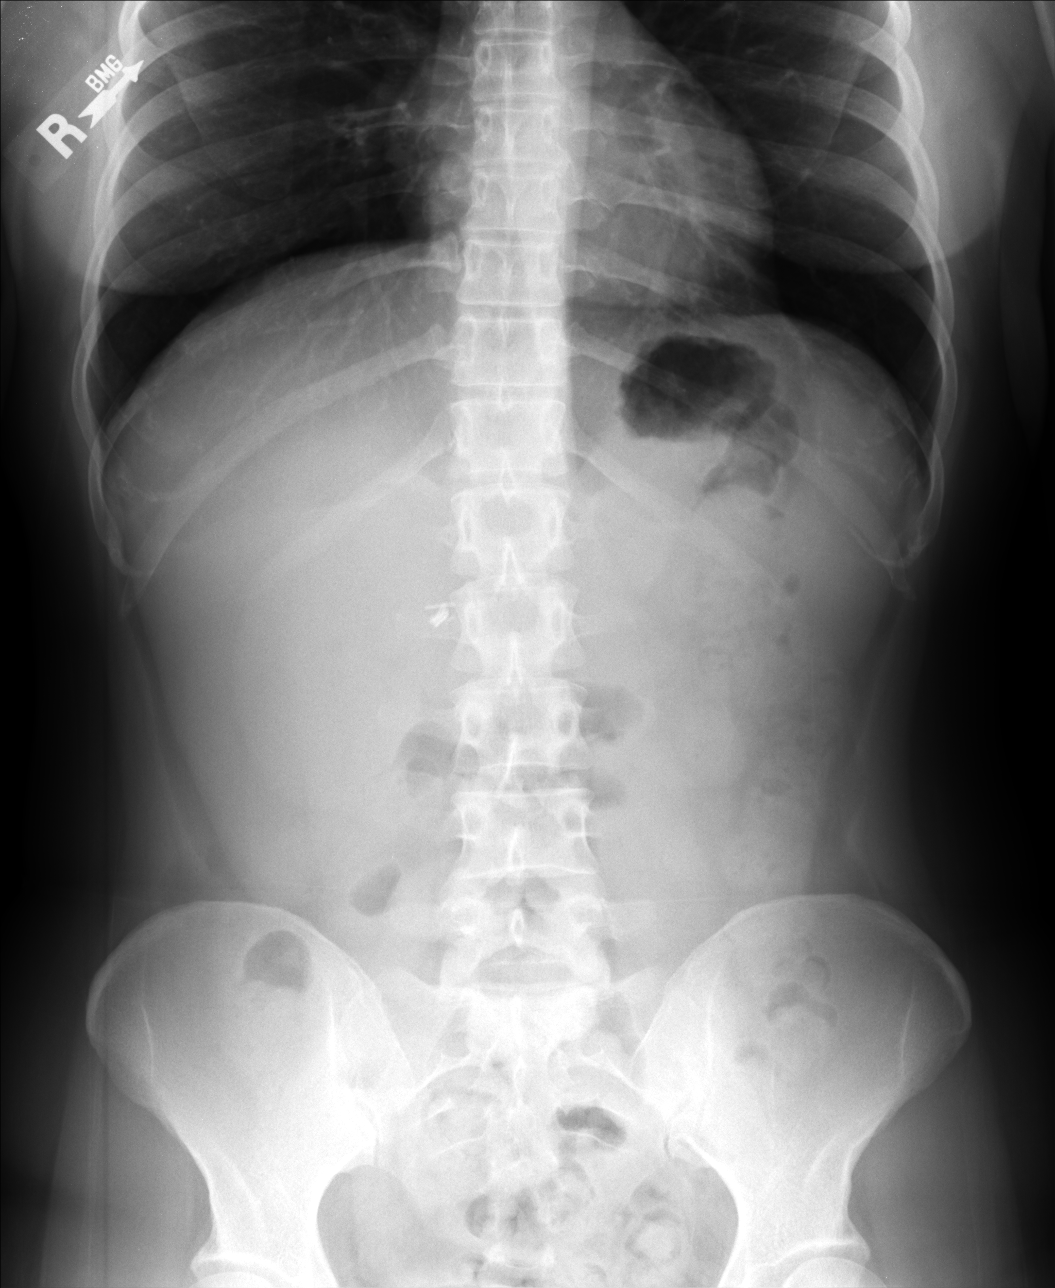

[AP (2 of 2)]
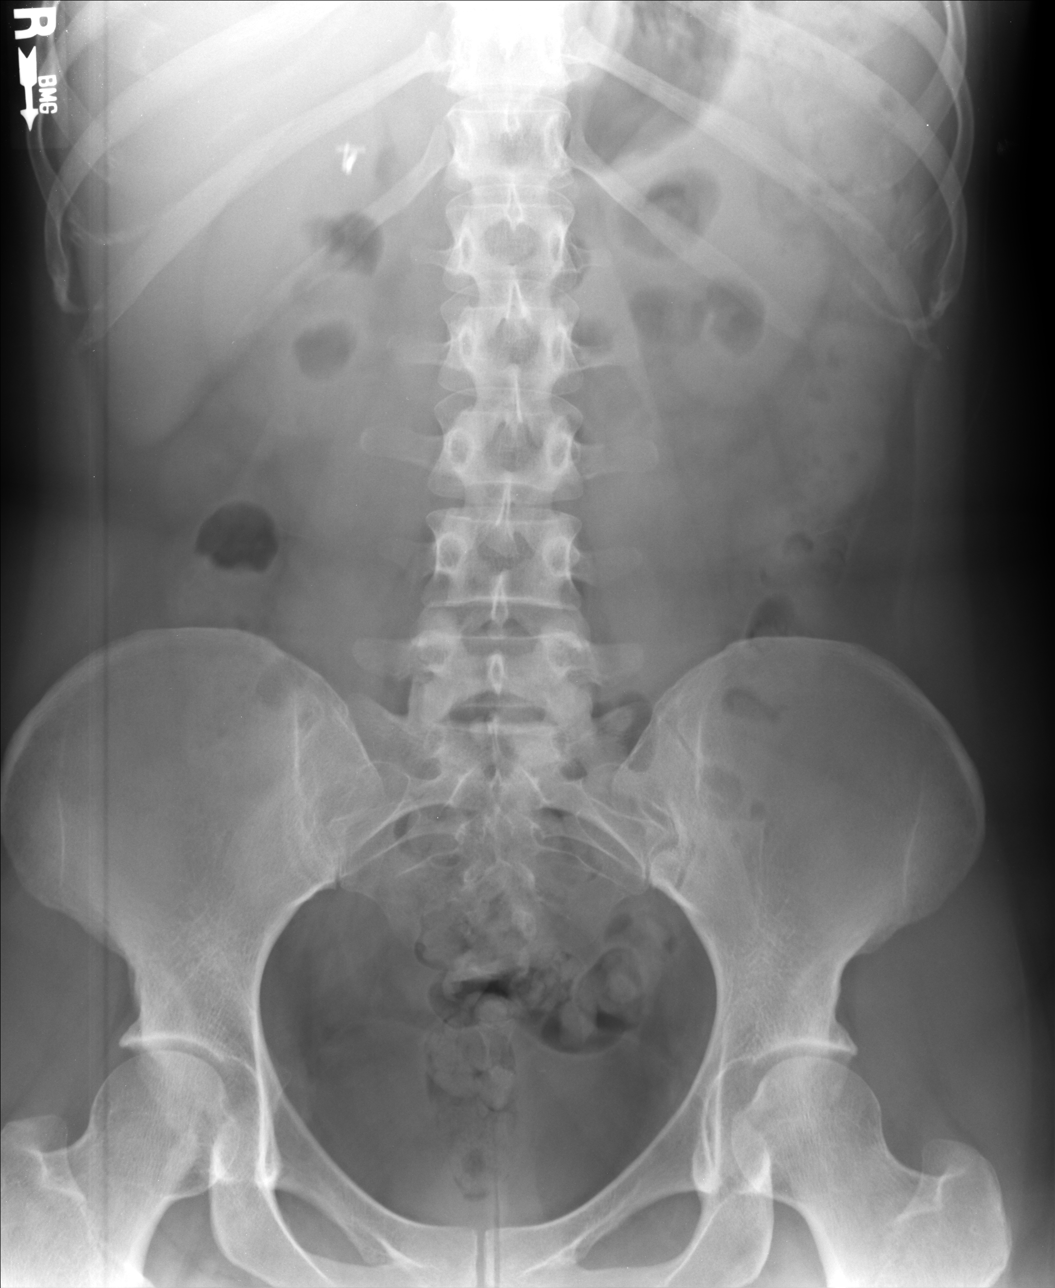

[3 of 3 positions shown; findings below may reference images not displayed]

FINDINGS: The liver span is 20.7 cm. This may represent normal variant
Riedel's lobe of the liver or hepatomegaly. Cholecystectomy clips
are present. The splenic span is 11.4 cm, within normal limits.
Bowel gas pattern is nonobstructive. No dilated loops of large or
small bowel. No air-fluid levels. No free air.
IMPRESSION: 1. No acute abnormality.
2. 20.7 liver spur this occurs at 20.7 cm liver span which may
represent hepatomegaly or normal variant Riedel's lobe of the liver.

## 2014-08-24 DIAGNOSIS — Z95828 Presence of other vascular implants and grafts: Secondary | ICD-10-CM | POA: Insufficient documentation

## 2015-06-21 ENCOUNTER — Ambulatory Visit (INDEPENDENT_AMBULATORY_CARE_PROVIDER_SITE_OTHER): Payer: Self-pay | Admitting: Physician Assistant

## 2015-06-21 VITALS — BP 120/80 | HR 91 | Temp 98.5°F | Resp 17 | Ht 60.0 in | Wt 166.0 lb

## 2015-06-21 DIAGNOSIS — R059 Cough, unspecified: Secondary | ICD-10-CM

## 2015-06-21 DIAGNOSIS — J019 Acute sinusitis, unspecified: Secondary | ICD-10-CM

## 2015-06-21 DIAGNOSIS — R1013 Epigastric pain: Secondary | ICD-10-CM

## 2015-06-21 DIAGNOSIS — R05 Cough: Secondary | ICD-10-CM

## 2015-06-21 DIAGNOSIS — B9689 Other specified bacterial agents as the cause of diseases classified elsewhere: Secondary | ICD-10-CM

## 2015-06-21 MED ORDER — IPRATROPIUM BROMIDE 0.03 % NA SOLN: 2.0000 | Freq: Two times a day (BID) | NASAL | Status: DC

## 2015-06-21 MED ORDER — GUAIFENESIN ER 1200 MG PO TB12: 1.0000 | ORAL_TABLET | Freq: Two times a day (BID) | ORAL | Status: DC | PRN

## 2015-06-21 MED ORDER — HYDROCOD POLST-CPM POLST ER 10-8 MG/5ML PO SUER: 5.0000 mL | Freq: Two times a day (BID) | ORAL | Status: DC | PRN

## 2015-06-21 MED ORDER — AMOXICILLIN-POT CLAVULANATE 875-125 MG PO TABS: 1.0000 | ORAL_TABLET | Freq: Two times a day (BID) | ORAL | Status: DC

## 2015-06-21 NOTE — Progress Notes (Signed)
Subjective:     Patient ID: Kelly [name], female   DOB: Apr 26, 1980, 35 y.o.   MRN: [id number]  HPI The patient is a 35 year old female PMH with leukemia in remission for 1.5 years, presents today due to cough.  Symptoms started with upper respiratory and cold symptoms two weeks ago. With nasal congestion, rhinorrhea, some ear fullness, sneezing, and mild sore throat. Patient tried alka seltzer cold and cough, expectorant cough medicine with minimal relief.   1 week ago the non productive cough started, and nasal congestion came back. Denies fevers, fatigue, body aches, chills or sweats. Denies chest tightness,denies SOB or DOE, endorses some wheezing and sinus pressure. States cough has progressed from upper chest to lower chest. No chest pressure or chest tightness. Yesterday, advil allergy and sinus helped the congestion but cough has remained constant. No hx of asthma or seasonal allergies.  Patient also has a hx of gastritis. Abdominal pain started three weeks ago. Endorses pyrosis when she eats late at night or fried foods. Denies burning, endorses upsetting. Patient takes omeprazole 10mg  OTC before meals intermittently not daily, helps minimally. Denies nausea, vomiting, constipation or diarrhea.   Review of Systems All pertinent ROS as above in HPI    Objective: Filed Vitals:   06/21/15 1518  BP: 120/80  Pulse: 91  Temp: 98.5 F (36.9 C)  Resp: 17      Physical Exam  Constitutional: She appears well-developed and well-nourished. No distress.  HENT:  Head: Normocephalic and atraumatic.  Right Ear: External ear normal.  Left Ear: External ear normal.  Nose: Nose normal.  Mouth/Throat: Oropharynx is clear and moist. No oropharyngeal exudate.  Neck: Neck supple.  Cardiovascular: Normal rate, regular rhythm, normal heart sounds and intact distal pulses.  Exam reveals no gallop and no friction rub.   No murmur heard. Pulmonary/Chest: No respiratory distress. She has no wheezes.  She has no rales.  Lung fields CTA, no wheezes or overt crackles. Some mild inspiratory rales in lower left lobe  Abdominal: Soft. Bowel sounds are normal.  Significant tenderness to light and deep palpation over the epigastric region. All other quadrants non-tender  Lymphadenopathy:    She has cervical adenopathy.       Assessment:     The patient is a 35 year old female hx of leukemia, presents with two weeks with viral URI symptoms and one week of mucus cough with little production. Likely viral URI which resolved and developed into bacterial sinus infection, will treat with abx due to length of symptoms and also add for symptomatic relief.  Abdominal tenderness also likely gastritis vs acid reflux vs hiatal hernia. Will recommend daily omeprazole in the mornings, H2RA for breakthrough acid indigestion and consider gastroenterology referral after 3 months of treatment.    Plan:     1. Abdominal pain, epigastric - omeprazole (PRILOSEC) 20 MG capsule; Take 1 capsule (20 mg total) by mouth daily.  Dispense: 30 capsule; Refill: 2  2. Acute bacterial sinusitis - ipratropium (ATROVENT) 0.03 % nasal spray; Place 2 sprays into both nostrils 2 (two) times daily.  Dispense: 30 mL; Refill: 0 - Guaifenesin (MUCINEX MAXIMUM STRENGTH) 1200 MG TB12; Take 1 tablet (1,200 mg total) by mouth every 12 (twelve) hours as needed.  Dispense: 14 tablet; Refill: 1 - amoxicillin-clavulanate (AUGMENTIN) 875-125 MG tablet; Take 1 tablet by mouth 2 (two) times daily.  Dispense: 14 tablet; Refill: 0  3. Cough - chlorpheniramine-HYDROcodone (TUSSIONEX PENNKINETIC ER) 10-8 MG/5ML SUER; Take 5 mLs by  mouth every 12 (twelve) hours as needed for cough.  Dispense: 100 mL; Refill: 0

## 2015-06-21 NOTE — Patient Instructions (Addendum)
Try using OTC ranitidine (Zantac) 150 mg daily, instead of the omeprazole, for the reflux symptoms. It's safe to use long-term.  Things that often make reflux symptoms worse: Caffeine Carbonation (soda) Spicy foods Acidic foods (like tomato sauce, orange juice, lemonade) Fatty foods (including whole milk and ice cream) Stress (feeling sad, worried, nervous) Nicotine Alcohol NSAIDS (non-steroidal anti-inflammatories, like ibuprofen (Advil, Motrin) or naproxen (Aleve)).     IF you received an x-ray today, you will receive an invoice from [facility]. Please contact [facility] at [phone] with questions or concerns regarding your invoice.   IF you received labwork today, you will receive an invoice from [employer]. Please contact [facility] at [phone] with questions or concerns regarding your invoice.   Our billing staff will not be able to assist you with questions regarding bills from these companies.  You will be contacted with the lab results as soon as they are available. The fastest way to get your results is to activate your My Chart account. Instructions are located on the last page of this paperwork. If you have not heard from [place] regarding the results in 2 weeks, please contact this office.

## 2015-06-21 NOTE — Progress Notes (Signed)
Patient ID: Kelly [name], female    DOB: Apr 15, 1980, 35 y.o.   MRN: [id number]  PCP: No primary care provider on file.  Subjective:   Chief Complaint  Patient presents with  . Cough  . Nasal Congestion    HPI Presents for evaluation of cough. She has a history of leukemia, in remission x 18 months.  Symptoms started with upper respiratory and cold symptoms two weeks ago. With nasal congestion, rhinorrhea, some ear fullness, sneezing, and mild sore throat. Patient tried alka seltzer cold and cough, expectorant cough medicine with minimal relief.   1 week ago the non productive cough started, and nasal congestion came back. Denies fevers, fatigue, body aches, chills or sweats. Denies chest tightness,denies SOB or DOE, endorses some wheezing and sinus pressure. States cough has progressed from upper chest to lower chest. No chest pressure or chest tightness. Yesterday, advil allergy and sinus helped the congestion but cough has remained constant. No hx of asthma or seasonal allergies.  Patient also has a hx of gastritis. Abdominal pain started three weeks ago. Endorses pyrosis when she eats late at night or fried foods. Denies burning, endorses upsetting. Patient takes omeprazole 10mg  OTC before meals intermittently not daily, helps minimally. Denies nausea, vomiting, constipation or diarrhea..     Review of Systems As above.    Patient Active Problem List   Diagnosis Date Noted  . 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 Cardiac device, implant, or graft complication [id number]  . History of DVT (deep vein thrombosis) 09/01/2013  . History of cerebral hemorrhage 09/01/2013  . 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 Other Other (See Comments)    Tylenol PM-- tingling Medications that causes drowsiness causes tingling  . Oxycodone Other (See Comments)      Tingling, nausea, dizzy       Objective:  Physical Exam  Constitutional: She is oriented to person, [name], and time. She appears well-developed and well-nourished. She is active and cooperative. No distress.  BP 120/80 mmHg  Pulse 91  Temp(Src) 98.5 F (36.9 C) (Oral)  Resp 17  Ht 5' (1.524 m)  Wt 166 lb (75.297 kg)  BMI 32.42 kg/m2  SpO2 96%  LMP 06/21/2015  HENT:  Head: Normocephalic and atraumatic.  Right Ear: Hearing normal.  Left Ear: Hearing normal.  Eyes: Conjunctivae are normal. No scleral icterus.  Neck: Normal range of motion. Neck supple. No thyromegaly present.  Cardiovascular: Normal rate, regular rhythm and normal heart sounds.   Pulses:      Radial pulses are 2+ on the right side, and 2+ on the left side.  Pulmonary/Chest: Effort normal. She has no decreased breath sounds. She has no wheezes. She has no rhonchi. She has no rales.  Breath sounds are coarse in the LEFT base  Abdominal: Soft. Normal appearance and bowel sounds are normal. There is no hepatosplenomegaly. There is tenderness in the epigastric area.  Lymphadenopathy:       Head (right side): No tonsillar, no preauricular, no posterior auricular and no occipital adenopathy present.       Head (left side): No tonsillar, no preauricular, no posterior auricular and no occipital adenopathy present.    She has no cervical adenopathy.       Right: No supraclavicular adenopathy present.       Left: No supraclavicular adenopathy present.  Neurological: She is alert and oriented to person, [name], and time. No sensory deficit.  Skin: Skin is warm, dry and intact. No rash noted. No cyanosis or erythema. Nails show no clubbing.  Psychiatric: She has a normal mood and affect. Her speech is normal and behavior is normal.           Assessment & Plan:   1. Acute bacterial sinusitis 2. Cough Likely bacterial sinusitis following viral URI. - ipratropium (ATROVENT) 0.03 % nasal spray; [name] 2 sprays into  both nostrils 2 (two) times daily.  Dispense: 30 mL; Refill: 0 - Guaifenesin (MUCINEX MAXIMUM STRENGTH) 1200 MG TB12; Take 1 tablet (1,200 mg total) by mouth every 12 (twelve) hours as needed.  Dispense: 14 tablet; Refill: 1 - amoxicillin-clavulanate (AUGMENTIN) 875-125 MG tablet; Take 1 tablet by mouth 2 (two) times daily.  Dispense: 14 tablet; Refill: 0 - chlorpheniramine-HYDROcodone (TUSSIONEX PENNKINETIC ER) 10-8 MG/5ML SUER; Take 5 mLs by mouth every 12 (twelve) hours as needed for cough.  Dispense: 100 mL; Refill: 0  3. Abdominal pain, epigastric Likely GERD. Refer to GI if symptoms persist. - omeprazole (PRILOSEC) 20 MG capsule; Take 1 capsule (20 mg total) by mouth daily. Dispense: 30 capsule; Refill: 2    Fara Chute, PA-C Physician Assistant-Certified [facility]

## 2015-08-17 ENCOUNTER — Other Ambulatory Visit: Payer: Self-pay | Admitting: Obstetrics and Gynecology

## 2015-08-18 LAB — CYTOLOGY - PAP

## 2015-10-12 ENCOUNTER — Encounter: Payer: Self-pay | Admitting: Physician Assistant

## 2015-10-12 DIAGNOSIS — C9201 Acute myeloblastic leukemia, in remission: Secondary | ICD-10-CM

## 2019-11-10 ENCOUNTER — Other Ambulatory Visit: Payer: Self-pay

## 2020-03-09 ENCOUNTER — Other Ambulatory Visit: Payer: Self-pay

## 2023-01-28 ENCOUNTER — Emergency Department (HOSPITAL_BASED_OUTPATIENT_CLINIC_OR_DEPARTMENT_OTHER)
Admission: EM | Admit: 2023-01-28 | Discharge: 2023-01-28 | Disposition: A | Discharge status: Home / Self Care | Payer: Self-pay | Attending: Emergency Medicine | Admitting: Emergency Medicine

## 2023-01-28 ENCOUNTER — Other Ambulatory Visit (HOSPITAL_BASED_OUTPATIENT_CLINIC_OR_DEPARTMENT_OTHER): Payer: Self-pay

## 2023-01-28 ENCOUNTER — Other Ambulatory Visit: Payer: Self-pay

## 2023-01-28 ENCOUNTER — Encounter (HOSPITAL_BASED_OUTPATIENT_CLINIC_OR_DEPARTMENT_OTHER): Payer: Self-pay | Admitting: Emergency Medicine

## 2023-01-28 DIAGNOSIS — N751 Abscess of Bartholin's gland: Secondary | ICD-10-CM | POA: Insufficient documentation

## 2023-01-28 MED ORDER — DOXYCYCLINE HYCLATE 100 MG PO TABS
100.0000 mg | ORAL_TABLET | Freq: Once | ORAL | Status: AC
  Administered 2023-01-28: 100 mg via ORAL
  Filled 2023-01-28: qty 1

## 2023-01-28 MED ORDER — LIDOCAINE-EPINEPHRINE 2 %-1:100000 IJ SOLN: 20.0000 mL | Freq: Once | INTRAMUSCULAR | Status: DC

## 2023-01-28 MED ORDER — DOXYCYCLINE HYCLATE 100 MG PO CAPS
100.0000 mg | ORAL_CAPSULE | Freq: Two times a day (BID) | ORAL | 0 refills | Status: AC
  Filled 2023-01-28: qty 14, 7d supply, fill #0

## 2023-01-28 MED ORDER — IBUPROFEN 800 MG PO TABS
800.0000 mg | ORAL_TABLET | Freq: Once | ORAL | Status: AC
  Administered 2023-01-28: 800 mg via ORAL
  Filled 2023-01-28: qty 1

## 2023-01-28 MED ORDER — LIDOCAINE HCL 2 % IJ SOLN
10.0000 mL | Freq: Once | INTRAMUSCULAR | Status: AC
  Administered 2023-01-28: 200 mg via INTRADERMAL
  Filled 2023-01-28: qty 20

## 2023-01-28 NOTE — ED Triage Notes (Signed)
Recurrent bartholin abscess. No vaginal discharge . Obvious distress

## 2023-01-28 NOTE — ED Provider Notes (Signed)
[name] Provider Note   CSN: [id number] Arrival date & time: 01/28/23  2952     History  Chief Complaint  Patient presents with   Abscess    bartholin    Shawnique Kelly [name] is a 42 y.o. female with PMHx leukemia in remission, recurrent bartholin cyst who presents to ED concerned for bartholin abscess x2 days. Patient stating that her last cysts was many years ago. Patient currently not following with GYN. Has been attempting sitz baths and antiinflammatories without relief.  LMP 1 week ago.  Denies fever, chest pain, dyspnea, cough, nausea, vomiting, diarrhea, dysuria, hematuria, vaginal discharge.    Abscess      Home Medications Prior to Admission medications   Medication Sig Start Date End Date Taking? Authorizing Provider  doxycycline (VIBRAMYCIN) 100 MG capsule Take 1 capsule (100 mg total) by mouth 2 (two) times daily for 7 days. 01/28/23 02/04/23 Yes Amaziah Ghosh, Charlotte Sanes F, PA-C  chlorpheniramine-HYDROcodone (TUSSIONEX PENNKINETIC ER) 10-8 MG/5ML SUER Take 5 mLs by mouth every 12 (twelve) hours as needed for cough. 06/21/15   Porfirio Oar, PA  Guaifenesin (MUCINEX MAXIMUM STRENGTH) 1200 MG TB12 Take 1 tablet (1,200 mg total) by mouth every 12 (twelve) hours as needed. 06/21/15   Porfirio Oar, PA  ipratropium (ATROVENT) 0.03 % nasal spray Place 2 sprays into both nostrils 2 (two) times daily. 06/21/15   Porfirio Oar, PA      Allergies    Other and Oxycodone    Review of Systems   Review of Systems  Genitourinary:        Bartholin absess    Physical Exam Updated Vital Signs BP 125/83 (BP Location: Right Arm)   Pulse (!) 110   Temp 98.8 F (37.1 C) (Oral)   Resp 16   Wt 85.3 kg   LMP 01/21/2023 (Exact Date)   SpO2 97%   BMI 36.72 kg/m  Physical Exam Vitals and nursing note reviewed. Exam conducted with a chaperone present.  Constitutional:      General: She is not in acute distress.    Appearance: She is  not ill-appearing or toxic-appearing.  HENT:     Head: Normocephalic and atraumatic.  Eyes:     General: No scleral icterus.       Right eye: No discharge.        Left eye: No discharge.     Conjunctiva/sclera: Conjunctivae normal.  Cardiovascular:     Rate and Rhythm: Normal rate.  Pulmonary:     Effort: Pulmonary effort is normal.  Abdominal:     General: Abdomen is flat.  Genitourinary:    Comments: Chaperone Ezra (Tech) present for exam: 3cm swelling around left bartholin gland area. Fluctuant. Purulence easily expressed after aspiration with 18g needle. No vaginal discharge appreciated. Skin:    General: Skin is warm and dry.  Neurological:     General: No focal deficit present.     Mental Status: She is alert. Mental status is at baseline.  Psychiatric:        Mood and Affect: Mood normal.        Behavior: Behavior normal.     ED Results / Procedures / Treatments   Labs (all labs ordered are listed, but only abnormal results are displayed) Labs Reviewed  AEROBIC/ANAEROBIC CULTURE W GRAM STAIN (SURGICAL/DEEP WOUND)    EKG None  Radiology No results found.  Procedures .Marland KitchenIncision and Drainage  Date/Time: 01/28/2023 10:37 AM  Performed by: Dorthy Cooler, PA-C  Authorized by: Dorthy Cooler, PA-C   Consent:    Consent obtained:  Verbal   Consent given by:  Patient   Risks, benefits, and alternatives were discussed: yes     Risks discussed:  Bleeding, damage to other organs, incomplete drainage, infection and pain   Alternatives discussed:  No treatment Universal protocol:    Procedure explained and questions answered to patient or proxy's satisfaction: yes     Patient identity confirmed:  Verbally with patient Location:    Type:  Bartholin cyst   Size:  3cm   Location:  Anogenital   Anogenital location:  Bartholin's gland Pre-procedure details:    Skin preparation:  Povidone-iodine Anesthesia:    Anesthesia method:  Local infiltration    Local anesthetic:  Lidocaine 2% w/o epi Procedure type:    Complexity:  Simple Procedure details:    Needle aspiration: yes     Needle size:  18 G   Incision types:  Stab incision   Drainage:  Purulent   Drainage amount:  Moderate   Wound treatment:  Wound left open   Packing materials:  None Post-procedure details:    Procedure completion:  Tolerated well, no immediate complications Comments:     Injected Lidocaine and then aspirated purulence into 18g needle. Purulence was freely flowing from abscess after needle was removed. Rest of abscess drained with applied pressure. Patient tolerated well.     Medications Ordered in ED Medications  lidocaine (XYLOCAINE) 2 % (with pres) injection 200 mg (200 mg Intradermal Given by Other 01/28/23 1016)  doxycycline (VIBRA-TABS) tablet 100 mg (100 mg Oral Given 01/28/23 1050)  ibuprofen (ADVIL) tablet 800 mg (800 mg Oral Given 01/28/23 1058)    ED Course/ Medical Decision Making/ A&P                                 Medical Decision Making Risk Prescription drug management.    This patient presents to the ED for concern of abscess, this involves an extensive number of treatment options, and is a complaint that carries with it a high risk of complications and morbidity.  The differential diagnosis includes cellulitis, abscess, sepsis, folliculitis, necrotizing fasciitis, impetigo   Co morbidities that complicate the patient evaluation  leukemia in remission, recurrent bartholin cyst    Additional history obtained:  It does not appear the patient follows with PCP.  Will refer to community clinic.   Lab Tests:  I Ordered, and personally interpreted labs.  The pertinent results include:   -wound culture: pending   Problem List / ED Course / Critical interventions / Medication management  Patient presents to ED concerned for left bartholin abscess.  Patient stating that has been many years since her last Bartholin abscess. These  symptoms started 2 days ago. Patient has attempted sitz bath's and anti-inflammatories without relief.  Educated patient on the need for GYN follow-up.  Patient not currently with GYN but stating that she will establish care later today.  Patient denying any other infectious symptoms today. LMP 1 week ago. Physical exam concerning for left-sided bartholin swelling and abscess. Abscess successfully drained without complications. Area n/v intact. Provided patient with a dose of doxycycline in ED and sending rest of prescription to pharmacy. Also provided patient with Ibuprofen for pain management.  Educated patient on alternating ibuprofen and Tylenol for pain management. I have reviewed the patients home medicines and have made adjustments as needed Patient  afebrile with stable vitals.  Provided with return precautions.  Discharged condition.   Social Determinants of Health:  none         Final Clinical Impression(s) / ED Diagnoses Final diagnoses:  Bartholin's gland abscess    Rx / DC Orders ED Discharge Orders          Ordered    doxycycline (VIBRAMYCIN) 100 MG capsule  2 times daily        01/28/23 1055              Dorthy Cooler, [place] 01/28/23 1211    Virgina Norfolk, DO 01/28/23 1527

## 2023-01-28 NOTE — Discharge Instructions (Addendum)
It was a pleasure caring for you today.  Please follow-up with GYN.  Seek emergency care if experiencing any new or worsening symptoms.  Alternating between 650 mg Tylenol and 400 mg Advil: The best way to alternate taking Acetaminophen (example Tylenol) and Ibuprofen (example Advil/Motrin) is to take them 3 hours apart. For example, if you take ibuprofen at 6 am you can then take Tylenol at 9 am. You can continue this regimen throughout the day, making sure you do not exceed the recommended maximum dose for each drug.

## 2023-01-29 ENCOUNTER — Other Ambulatory Visit: Payer: Self-pay

## 2023-01-29 ENCOUNTER — Emergency Department (HOSPITAL_BASED_OUTPATIENT_CLINIC_OR_DEPARTMENT_OTHER)
Admission: EM | Admit: 2023-01-29 | Discharge: 2023-01-29 | Disposition: A | Discharge status: Home / Self Care | Payer: Self-pay | Attending: Emergency Medicine | Admitting: Emergency Medicine

## 2023-01-29 DIAGNOSIS — N751 Abscess of Bartholin's gland: Secondary | ICD-10-CM | POA: Insufficient documentation

## 2023-01-29 MED ORDER — LIDOCAINE HCL (PF) 1 % IJ SOLN
5.0000 mL | Freq: Once | INTRAMUSCULAR | Status: AC
  Administered 2023-01-29: 5 mL
  Filled 2023-01-29: qty 5

## 2023-01-29 NOTE — Discharge Instructions (Addendum)
Warm compresses/sitz baths. Use a PeriCare bottle when voiding (urinating). Take antibiotics as prescribed. Follow up with GYN. Return to the ER for worsening or concerning symptoms.

## 2023-01-29 NOTE — ED Triage Notes (Signed)
Pt is returning from being seen yesterday in ED. Reports having hx of bartholin abscess in vaginal area.  Stated yesterday she was giving numbing medicine without having abscess drained. States the cyst has grown and not improving.

## 2023-01-29 NOTE — ED Provider Notes (Signed)
[place] [facility] Provider Note   CSN: [id number] Arrival date & time: 01/29/23  4403     History  Chief Complaint  Patient presents with   Abscess    Kelly [name] is a 42 y.o. female.  42 year old female presents with concern for worsening/recurrent bartholin gland abscess. Reports I&D here yesterday by way of needle aspiration. States she did not feel immediate relief like she has in the past with same problem and woke up with return of swelling with worsening pain. Reports last bartholin gland abscess was 10 years ago. Has had Word catheter in the past but felt like it didn't stay in long. Was scheduled to see GYN today but canceled to return to ER for recheck.        Home Medications Prior to Admission medications   Medication Sig Start Date End Date Taking? Authorizing Provider  chlorpheniramine-HYDROcodone (TUSSIONEX PENNKINETIC ER) 10-8 MG/5ML SUER Take 5 mLs by mouth every 12 (twelve) hours as needed for cough. 06/21/15   Porfirio Oar, PA  doxycycline (VIBRAMYCIN) 100 MG capsule Take 1 capsule (100 mg total) by mouth 2 (two) times daily for 7 days. 01/28/23 02/04/23  Dorthy Cooler, PA-C  Guaifenesin (MUCINEX MAXIMUM STRENGTH) 1200 MG TB12 Take 1 tablet (1,200 mg total) by mouth every 12 (twelve) hours as needed. 06/21/15   Porfirio Oar, PA  ipratropium (ATROVENT) 0.03 % nasal spray Place 2 sprays into both nostrils 2 (two) times daily. 06/21/15   Porfirio Oar, PA      Allergies    Other and Oxycodone    Review of Systems   Review of Systems Negative except as per HPI Physical Exam Updated Vital Signs BP 116/86   Pulse (!) 118   Temp 98.6 F (37 C) (Oral)   Resp 18   LMP 01/21/2023 (Exact Date)   SpO2 96%  Physical Exam Vitals and nursing note reviewed. Exam conducted with a chaperone present.  Constitutional:      General: She is not in acute distress.    Appearance: She is well-developed. She is not  diaphoretic.  HENT:     Head: Normocephalic and atraumatic.  Pulmonary:     Effort: Pulmonary effort is normal.  Genitourinary:    Comments: Swelling to left labia minora and bartholin gland with tenderness. No active drainage.  Neurological:     Mental Status: She is alert and oriented to person, place, and time.  Psychiatric:        Behavior: Behavior normal.     ED Results / Procedures / Treatments   Labs (all labs ordered are listed, but only abnormal results are displayed) Labs Reviewed - No data to display  EKG None  Radiology No results found.  Procedures .Marland KitchenIncision and Drainage  Date/Time: 01/29/2023 11:40 AM  Performed by: Jeannie Fend, PA-C Authorized by: Jeannie Fend, PA-C   Consent:    Consent obtained:  Verbal   Consent given by:  Patient   Risks, benefits, and alternatives were discussed: yes     Risks discussed:  Bleeding, incomplete drainage, infection and pain   Alternatives discussed:  No treatment Universal protocol:    Patient identity confirmed:  Verbally with patient Location:    Type:  Bartholin cyst   Size:  2cm x 3cm   Location:  Anogenital   Anogenital location:  Bartholin's gland Pre-procedure details:    Skin preparation:  Povidone-iodine Sedation:    Sedation type:  None Anesthesia:  Anesthesia method:  Local infiltration   Local anesthetic:  Lidocaine 1% w/o epi Procedure type:    Complexity:  Simple Procedure details:    Ultrasound guidance: no     Needle aspiration: no     Incision types:  Stab incision   Drainage:  Purulent   Drainage amount:  Moderate   Wound treatment:  Wound left open   Packing materials:  None Post-procedure details:    Procedure completion:  Tolerated well, no immediate complications Comments:     Shared decision making, opted out of Word catheter      Medications Ordered in ED Medications  lidocaine (PF) (XYLOCAINE) 1 % injection 5 mL (5 mLs Infiltration Given 01/29/23 1055)    ED  Course/ Medical Decision Making/ A&P                                 Medical Decision Making Risk Prescription drug management.   42 year old for female returns to the ER with recurrent left Bartholin gland abscess.  Seen here yesterday for same, had aspiration with 18-gauge needle however area has not continued to drain and is more swollen and painful today.  She is taking the antibiotics as prescribed.  Chaperone present for exam, does have fluctuant area with edema to the labia minora.  Patient would like I&D with stab incision as she feels the aspiration was not sufficient yesterday.  Area was anesthetized with lidocaine using an insulin syringe/needle.  Patient tolerated procedure well.  Appropriate anesthetized and stabbed with 11 blade scalpel with purulent drainage present.  Offered to place Word catheter however discussed possibility of catheter falling off and not actually staying in, patient has experienced this in the past and defers catheter.  Recommend continue on the antibiotics, warm compresses, sitz bath and PERI care bottle, follow-up with gynecology ASAP.  Return to ER for worsening or concerning symptoms.        Final Clinical Impression(s) / ED Diagnoses Final diagnoses:  Bartholin's gland abscess    Rx / DC Orders ED Discharge Orders     None         Jeannie Fend, PA-C 01/29/23 1143    Rolan Bucco, MD 01/29/23 1413

## 2023-01-31 LAB — AEROBIC CULTURE W GRAM STAIN (SUPERFICIAL SPECIMEN)
Culture: NORMAL
Gram Stain: NONE SEEN

## 2023-02-01 ENCOUNTER — Telehealth (HOSPITAL_BASED_OUTPATIENT_CLINIC_OR_DEPARTMENT_OTHER): Payer: Self-pay

## 2023-02-01 NOTE — Telephone Encounter (Signed)
Post ED Visit - Positive Culture Follow-up  Culture report reviewed by antimicrobial stewardship pharmacist: [facility] []  Enzo Bi, Pharm.D. []  Celedonio Miyamoto, Pharm.D., BCPS AQ-ID []  Garvin Fila, Pharm.D., BCPS []  Georgina Pillion, Pharm.D., BCPS []  [place], [address].D., BCPS, AAHIVP []  Estella Husk, Pharm.D., BCPS, AAHIVP []  Lysle Pearl, PharmD, BCPS []  Phillips Climes, PharmD, BCPS []  Agapito Games, PharmD, BCPS []  Verlan Friends, PharmD []  Mervyn Gay, PharmD, BCPS []  Vinnie Level, PharmD  [facility] [x]  Delmar Landau, PharmD []  Greer Pickerel, PharmD []  Adalberto Cole, PharmD []  Perlie Gold, Rph []  Lonell Face) Jean Rosenthal, PharmD []  Earl Many, PharmD []  Junita Push, PharmD []  Dorna Leitz, PharmD []  Terrilee Files, PharmD []  Lynann Beaver, PharmD []  Keturah Barre, PharmD []  Loralee Pacas, PharmD []  Bernadene Person, PharmD   Positive abscess culture Treated with Doxycycline Hyclate, organism sensitive to the same and no further patient follow-up is required at this time.  Sandria Senter 02/01/2023, 3:43 PM

## 2023-03-13 ENCOUNTER — Ambulatory Visit: Payer: Self-pay | Admitting: Radiology

## 2023-06-05 ENCOUNTER — Encounter: Payer: Self-pay | Admitting: Obstetrics and Gynecology

## 2023-06-05 ENCOUNTER — Ambulatory Visit (INDEPENDENT_AMBULATORY_CARE_PROVIDER_SITE_OTHER): Payer: Self-pay | Admitting: Obstetrics and Gynecology

## 2023-06-05 VITALS — BP 126/82 | HR 85

## 2023-06-05 DIAGNOSIS — N750 Cyst of Bartholin's gland: Secondary | ICD-10-CM

## 2023-06-06 NOTE — Progress Notes (Signed)
   Acute Office Visit  Subjective:    Patient ID: Kelly [name], female    DOB: 28-Dec-1980, 43 y.o.   MRN: [id number]   HPI 43 y.o. presents today for Office Visit (Consult for recurring barth cyst(wants marsupialization)//jm/Went to Ed Nov 25th 2024 , January 2025 and yesterday 06/04/23. Incision and drainage yesterday. On Left side. ) . Reports several bartholin cysts with drainage since her teenage years. Had a word cath put in for 6 weeks and then had a longer break from recurrence.  It is always on the left side. When she was 49, she had it recur 4-5times  Patient's last menstrual period was 05/11/2023 (exact date). 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  Discussed surgery in detail and the r/b/a/I but not limited to the risk of bleeding, infection, scar tissue, dyspareunia, pain, need for PT, poor cosmesis. She wanted to place another word catheter, if it occurs again.  Discussed after multiple recurrences and being over the age of 66 or PM, then excision is better.  Discussed risk of cancer with the bartholin gland in this age range as well. Discussed that with a word catheter, this should not be left in beyond 2 weeks. She would like to see what the cost for excision is and have this in  place, if needed.  Counseled most optimal time for excision is when it is full again. 15 minutes spent on reviewing records, imaging,  and one on one patient time and counseling patient and documentation Dr. Judith Blonder

## 2023-07-23 ENCOUNTER — Ambulatory Visit (INDEPENDENT_AMBULATORY_CARE_PROVIDER_SITE_OTHER): Payer: Self-pay | Admitting: Obstetrics and Gynecology

## 2023-07-23 ENCOUNTER — Encounter: Payer: Self-pay | Admitting: Obstetrics and Gynecology

## 2023-07-23 VITALS — BP 108/76 | HR 87

## 2023-07-23 DIAGNOSIS — N750 Cyst of Bartholin's gland: Secondary | ICD-10-CM

## 2023-07-23 NOTE — Progress Notes (Signed)
   Acute Office Visit  Subjective:    Patient ID: Kelly [name], female    DOB: 08-06-1980, 43 y.o.   MRN: [id number]   HPI 43 y.o. presents today for surgical consult (Surgical consult//jj) . Reports several bartholin cysts with drainage since her teenage years. Had a word cath put in for 6 weeks and then had a longer break from recurrence.  It is always on the left side. When she was 73, she had it recur 4-5times  Reports since her last visit she had another one on the left side and it took more time to develop and was not painful.  It did rupture and a small bump is left.  She is ready to proceed with surgery for excision of the bartholin's cyst and gland. Discussed best time for excision is when it is full.   No LMP recorded.    Review of Systems     Objective:    OBGyn Exam  Left side with incision site cyst flat no erythema or further discharge noted  There were no vitals taken for this visit. 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  Discussed again in detail the surgery and the r/b/a/I but not limited to the risk of bleeding, infection, scar tissue, dyspareunia, pain, need for PT, poor cosmesis. Discussed other options with continued I&D, word cath placement.  Discussed after multiple recurrences and being over the age of 34 or PM, then excision is better.  Discussed risk of cancer with the bartholin gland in this age range as well. She will call at the beginning of when the cyst is starting to fill so there is time to schedule the surgery.  Discussed we will need to do a preop as well and I can examine it before surgery. 15 minutes spent on reviewing records, imaging,  and one on one patient time and  counseling patient and documentation Dr. Caro Christmas

## 2024-03-11 ENCOUNTER — Telehealth: Payer: Self-pay

## 2024-03-11 NOTE — Telephone Encounter (Signed)
 Patient is aware a message was sent to [facility] to see about scheduling her for surgery.

## 2024-03-11 NOTE — Telephone Encounter (Signed)
 Patient was last seen on 07-23-23 for frequent bartholin cyst. Per office visit note, patient was told to call when she first starts to develop a new cyst, to give time to schedule surgery for removal. Patient was told at appointment that she would also need a pre-op done so it could be examined before surgery. Patient states she just started getting a new cyst on the left side as before. She states it is marble size. It is uncomfortable but does not hurt yet. Patient wanted to know if she needed to come in for ov to be examined first or if she should just be scheduled for surgery. Please advise.

## 2024-03-12 ENCOUNTER — Other Ambulatory Visit: Payer: Self-pay | Admitting: Obstetrics and Gynecology

## 2024-03-12 DIAGNOSIS — N750 Cyst of Bartholin's gland: Secondary | ICD-10-CM

## 2024-03-12 NOTE — Telephone Encounter (Signed)
 Dr. Glennon -please place new surgery referral.

## 2024-03-15 NOTE — Telephone Encounter (Signed)
 See surgery referral.   Encounter closed.

## 2024-04-14 ENCOUNTER — Encounter: Payer: Self-pay | Admitting: Obstetrics and Gynecology

## 2024-04-14 ENCOUNTER — Ambulatory Visit (INDEPENDENT_AMBULATORY_CARE_PROVIDER_SITE_OTHER): Payer: Self-pay | Admitting: Obstetrics and Gynecology

## 2024-04-14 VITALS — BP 106/62 | HR 92

## 2024-04-14 DIAGNOSIS — N907 Vulvar cyst: Secondary | ICD-10-CM

## 2024-04-14 NOTE — Progress Notes (Signed)
" ° °  Acute Office Visit  Subjective:    Patient ID: Kelly [name], female    DOB: Oct 13, 1980, 44 y.o.   MRN: [id number]   HPI 44 y.o. presents today for Follow-up (Pt was to come in when she gets a bartholins cyst so she can get surgery//jj/Patient states it has gone down a lot. Also having irregular cycle) . Patient had two months without a period and then had a period this month.  Noticed the cyst in her labia has returned. She would like to have it removed  Patient's last menstrual period was 04/09/2024 (exact date). Period Duration (Days):  (varies) Period Pattern: (!) Irregular Menstrual Flow:  (varies) Menstrual Control: Maxi pad Dysmenorrhea: None  Review of Systems     Objective:    OBGyn Exam  BP 106/62   Pulse 92   LMP 04/09/2024 (Exact Date)   SpO2 99%  Wt Readings from Last 3 Encounters:  01/28/23 188 lb (85.3 kg)  06/21/15 166 lb (75.3 kg)  07/14/13 139 lb (63 kg)       Area seen on left where prior I&D was performed for bartholin's cyst. Left labia with contained 4cm cyst and separate from the bartholin cyst area  Assessment & Plan:  Left labial cyst Discussed the labial cystectomy procedure and what to expect and recovery. Discussed risk of poor cosmesis, dyspareunia, wound separation, bleeding, scar tissue, pain, infection and risk the bartholin's cyst could develop later, since that would not be removed.   Patient would like to have it removed early next month before it resolves, as it does in the past.  Surgery request sent.  Almarie MARLA Carpen  "

## 2024-04-19 ENCOUNTER — Telehealth (HOSPITAL_BASED_OUTPATIENT_CLINIC_OR_DEPARTMENT_OTHER): Payer: Self-pay | Admitting: Obstetrics & Gynecology

## 2024-04-19 NOTE — Telephone Encounter (Signed)
 Spoke with patient. Scheduled for appt with Dr. Glennon on 04/20/24 at 1400, arrive at 1345. Patient will continue warm baths and compresses. ER for new/worsening symptoms. Patient appreciative of call.   Routing to Dr. Glennon LIPS.   Encounter closed.

## 2024-04-19 NOTE — Telephone Encounter (Signed)
 Pt called on call provider with worsening labial cyst.  Was seen by Dr. Glennon last week.  Note reviewed.  Feels it needs to be drained.  Offices closed due to weather.  She has gotten in hot bath tub, done Sitz baths, and using compressed.  Ok if doesn't move much but really painful with walking.  No fevers.  Med hx reviewed.  Not diabetic.  Options for care today discussed.  She really would prefer not to go to the ER if possible.  Has used urgent care in the past for Batholin's cyst abscess (but this is a labial cyst per note).  Has already called there and it is closed as well.  Feels she could wait until tomorrow.  Reached out to triage nurse for [facility] for possible appt scheduling.  Kate Africa, RN, will call pt directly for scheduling.

## 2024-04-20 ENCOUNTER — Encounter: Payer: Self-pay | Admitting: Obstetrics and Gynecology

## 2024-04-20 ENCOUNTER — Ambulatory Visit: Payer: Self-pay | Admitting: Obstetrics and Gynecology

## 2024-04-20 VITALS — BP 118/76 | HR 107 | Wt 182.0 lb

## 2024-04-20 DIAGNOSIS — N760 Acute vaginitis: Secondary | ICD-10-CM

## 2024-04-20 MED ORDER — SULFAMETHOXAZOLE-TRIMETHOPRIM 800-160 MG PO TABS: 1.0000 | ORAL_TABLET | Freq: Two times a day (BID) | ORAL | 0 refills | Status: DC

## 2024-04-20 MED ORDER — DOUBLE ANTIBIOTIC 500-10000 UNIT/GM EX OINT: 1.0000 | TOPICAL_OINTMENT | Freq: Two times a day (BID) | CUTANEOUS | 0 refills | Status: AC

## 2024-04-20 MED ORDER — LIDOCAINE 5 % EX OINT: 1.0000 | TOPICAL_OINTMENT | Freq: Four times a day (QID) | CUTANEOUS | 0 refills | Status: DC | PRN

## 2024-04-20 NOTE — Progress Notes (Signed)
" ° °  Acute Office Visit  Subjective:    Patient ID: Kelly [name], female    DOB: Sep 29, 1980, 44 y.o.   MRN: [id number]   HPI 44 y.o. presents today for Procedure (I&D - labia cyst - took advil  this morning, and two tylenol  this afternoon) . Low grade fever yesterday. Reports painful and started to drain a day or so ago.  Patient's last menstrual period was 04/09/2024 (exact date).    Review of Systems     Objective:    OBGyn Exam  BP 118/76 (BP Location: Right Arm, Patient Position: Sitting, Cuff Size: Normal)   Pulse (!) 107   Wt 182 lb (82.6 kg)   LMP 04/09/2024 (Exact Date)   SpO2 98%   BMI 35.54 kg/m  Wt Readings from Last 3 Encounters:  04/20/24 182 lb (82.6 kg)  01/28/23 188 lb (85.3 kg)  06/21/15 166 lb (75.3 kg)    Left labia with erythema and tenderness, opening seen No palpable cyst as with last exam but extreme swelling and pain Discussed she would need antibiotics and no cyst removal could be performed today Assessment & Plan:  Left labial infection: Discussed since already draining and with infection, a cyst removal at this time is not appropriate and the cyst cannot be identified.  To begin warm compressions with warm soapy water twice a day and begin bactrim . RTC in 10 days for exam or sooner with any concerns or persistent symptoms. Can also apply polymixin and lidocaine -see instructions  Almarie MARLA Carpen "

## 2024-04-22 ENCOUNTER — Telehealth: Payer: Self-pay

## 2024-04-22 NOTE — Telephone Encounter (Signed)
 Patient called and states that she was seen on Tuesday by Dr. Glennon to have a labia cyst drained. Patient started Bactrim  DS on Tuesday and started to have itching on just the vulva and labia that night. Patient states that itching is worse at night. She states that she is not having any itching anywhere else on her body and no swelling of her mouth or tongue.

## 2024-04-23 ENCOUNTER — Other Ambulatory Visit: Payer: Self-pay | Admitting: Obstetrics and Gynecology

## 2024-04-23 MED ORDER — CEPHALEXIN 500 MG PO CAPS: 500.0000 mg | ORAL_CAPSULE | Freq: Two times a day (BID) | ORAL | 0 refills | Status: DC

## 2024-04-23 NOTE — Telephone Encounter (Signed)
 Spoke with patient. Patient states she called on call provider 04/22/24, was instructed to stop Bactrim . Patient states she did not take PM dose, was able to sleep better last night.  Patient asking for alternative abx.   Medication discontinued and added to allergy list.   Patient reports itching has resolved this morning. Denies any other symptoms.   Advised I fill forward to Dr. Glennon, advised she is in the OR this morning, response may not be immediate. Our office will f/u with recommendations once reviewed. Patient agreeable.   Routing to Dr. Glennon.

## 2024-04-23 NOTE — Telephone Encounter (Signed)
 Patient notified. Encounter closed

## 2024-04-29 ENCOUNTER — Encounter: Payer: Self-pay | Admitting: Obstetrics and Gynecology

## 2024-04-29 ENCOUNTER — Ambulatory Visit: Payer: Self-pay | Admitting: Obstetrics and Gynecology

## 2024-04-29 VITALS — BP 118/78 | HR 71 | Ht 60.0 in | Wt 182.0 lb

## 2024-04-29 DIAGNOSIS — N750 Cyst of Bartholin's gland: Secondary | ICD-10-CM

## 2024-04-29 DIAGNOSIS — Z01818 Encounter for other preprocedural examination: Secondary | ICD-10-CM

## 2024-04-29 DIAGNOSIS — N907 Vulvar cyst: Secondary | ICD-10-CM

## 2024-04-29 MED ORDER — CEPHALEXIN 500 MG PO CAPS: 500.0000 mg | ORAL_CAPSULE | Freq: Two times a day (BID) | ORAL | 1 refills | Status: AC

## 2024-04-29 MED ORDER — LIDOCAINE 5 % EX OINT: 1.0000 | TOPICAL_OINTMENT | Freq: Four times a day (QID) | CUTANEOUS | 0 refills | Status: AC | PRN

## 2024-04-29 MED ORDER — HYDROCODONE-ACETAMINOPHEN 5-325 MG PO TABS: 1.0000 | ORAL_TABLET | Freq: Four times a day (QID) | ORAL | 0 refills | Status: AC | PRN

## 2024-04-29 MED ORDER — METOCLOPRAMIDE HCL 10 MG PO TABS: 10.0000 mg | ORAL_TABLET | Freq: Three times a day (TID) | ORAL | 0 refills | Status: AC | PRN

## 2024-04-29 MED ORDER — IBUPROFEN 800 MG PO TABS: 800.0000 mg | ORAL_TABLET | Freq: Four times a day (QID) | ORAL | 3 refills | Status: AC | PRN

## 2024-04-29 MED ORDER — ESTRADIOL 0.01 % VA CREA: 1.0000 | TOPICAL_CREAM | Freq: Every day | VAGINAL | 0 refills | Status: AC

## 2024-04-29 NOTE — Progress Notes (Signed)
 "  Acute Office Visit  Subjective:    Patient ID: Kelly [name], female    DOB: [birth year], 44 y.o.   MRN: [id number]  Preop H&P for labial cyst removal and bartholi's marsupialization  HPI 44 y.o. presents today for Pre-op Exam (Pt is concerned with how long is the healing process and much she will have to pay out of pocket.) .She is on the last few days of Keflex  Patient had two months without a period and then had a period this month.  Noticed the cyst in her labia has returned. 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 She would like to have it removed The cyst did start to express after her visit and became infected. She had allergic reaction to bactrim  and then changed to keflex  and has done better one this. Pain is better. Did notice more drainage today. She is scheduled for surgery on the 24th of this month.  Patient's last menstrual period was 04/09/2024 (exact date). Period Duration (Days): 3 Period Pattern: (!) Irregular Menstrual Flow: Light Menstrual Control: Maxi pad, Tampon Menstrual Control Change Freq (Hours): 3 Dysmenorrhea: None  Review of Systems     Objective:    OBGyn Exam  BP 118/78 (BP Location: Left Arm, Patient Position: Sitting, Cuff Size: Normal)   Pulse 71   Ht 5' (1.524 m)   Wt 182 lb (82.6 kg)   LMP 04/09/2024 (Exact Date)   SpO2 99%   BMI 35.54 kg/m  Wt Readings from Last 3 Encounters:  04/29/24 182 lb (82.6 kg)  04/20/24 182 lb (82.6 kg)  01/28/23 188 lb (85.3 kg)   OB History     Gravida  2   Para      Term      Preterm      AB  2   Living  0      SAB  2   IAB      Ectopic      Multiple      Live Births             Past Medical History:  Diagnosis Date   Bartholin cyst    Cancer (HCC)    Gallstones    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[facility])    Past Surgical History:  Procedure Laterality Date   CHOLECYSTECTOMY     INCISION AND DRAINAGE     WISDOM TOOTH EXTRACTION   03/25/2009   Social History   Socioeconomic History   Marital status: Single    Spouse name: Not on file   Number of children: Not on file   Years of education: Not on file   Highest education level: Not on file  Occupational History   Not on file  Tobacco Use   Smoking status: Former    Current packs/day: 0.00    Average packs/day: 0.3 packs/day    Types: Cigarettes    Quit date: 06/20/2013    Years since quitting: 10.8   Smokeless tobacco: Not on file  Substance and Sexual Activity   Alcohol use: No    Alcohol/week: 0.0 standard drinks of alcohol   Drug use: No   Sexual activity: Yes    Partners: Male    Comment: partner had vasectomy  Other Topics Concern   Not on file  Social History Narrative   Not on file   Social Drivers of Health   Tobacco Use: Medium Risk (04/29/2024)   Patient History    Smoking Tobacco Use:  Former    Smokeless Tobacco Use: Unknown    Passive Exposure: Not on Actuary Strain: Not on file  Food Insecurity: Low Risk  (03/06/2022)   Received from [facility] visits prior to 05/25/2022.   Food    Within the past 12 months, you worried that your food would run out before you got money to buy more food: Never true    Within the past 12 months, the food you bought just didn't last and you didn't have money to get more: Never true  Transportation Needs: No Transportation Needs (03/06/2022)   Received from [facility] visits prior to 05/25/2022.   Transportation    In the past 12 months, has lack of reliable transportation kept you from medical appointments, meetings, work or from getting things needed for daily living?: No  Physical Activity: Not on file  Stress: Not on file  Social Connections: Not on file  Depression (EYV7-0): Not on file  Alcohol Screen: Not on file  Housing: Not on file  Utilities: Not on file  Health Literacy: Not on file   Medications Ordered Prior to  Encounter[1] Allergies[2]  Blood pressure 118/78, pulse 71, height 5' (1.524 m), weight 182 lb (82.6 kg), last menstrual period 04/09/2024, SpO2 99%.     Area seen on left where prior I&D was performed for bartholin's cyst. Left labia with reduced swelling and tenderness but with persistent fluid.  Assessment & Plan:  Left labial cyst Recurrent bartholin's cyst Preop for labial cystectomy and marsupialization  Discussed the labial cystectomy procedure and what to expect and recovery. Discussed opening from the bartholin cyst scar and seeing if the bartholin gland could be removed and extending it superiorly. Discussed this may be difficult since it is not fully present. The labial cyst may be difficult to remove as well due to the swelling from the infection and since it has released as well. Discussed risk of poor cosmesis, dyspareunia, wound separation, bleeding, scar tissue, pain, infection and risk of an additional procedure. Discussed that sometimes the healing process can take 6 months to truly see the final results. Discussed in detail post care instructions with peri bottle and ice packs, lidocaine , estrogen cream, neosporin and aloe as options. To begin estrogen cream now until told to stop after surgery.  30 minutes spent on reviewing records, imaging,  and one on one patient time and counseling patient and documentation Dr. Glennon Almarie MARLA Glennon      [1]  Current Outpatient Medications on File Prior to Visit  Medication Sig Dispense Refill   naproxen (NAPROSYN) 500 MG tablet Take by mouth.     omeprazole (PRILOSEC) 20 MG capsule Take by mouth.     tiZANidine (ZANAFLEX) 4 MG tablet TAKE HALF TABLET BY MOUTH EVERY EIGHT HOURS AS NEEDED     No current facility-administered medications on file prior to visit.  [2]  Allergies Allergen Reactions   Bactrim  Ds [Sulfamethoxazole -Trimethoprim ] Itching   Bee Pollen Cough    Other Reaction(s): Cough (ALLERGY/intolerance)    Fish Allergy Itching    Fish (substance)   Hydrocodone  Other (See Comments)   Other Other (See Comments)    Tylenol  PM-- tingling Medications that causes drowsiness causes tingling   Oxycodone  Other (See Comments)    Tingling, nausea, dizzy   "

## 2024-04-29 NOTE — Patient Instructions (Addendum)
 Pocket of Fluid at the [facility] of the Labia (Bartholin's Cyst) Incision and Drainage: What to Know After After an incision and drainage procedure for a pocket of fluid at the base of the labia called a Bartholin's cyst, it's common to have mild pain. You may also have: Light discharge from your vagina that may have blood in it. Discomfort when wiping after using the bathroom. Swelling: Use ice the first few days Pain itching If stitches are used, they likely won't need to be taken out. They will absorb on their own. Follow these instructions at home: Medicines Take your medicines only as told. If you were given antibiotics, take them as told. Do not stop taking them even if you start to feel better. Bathing Use showers until instructed other wise No hot tubs or swimming until instructed otherwise Incision care Wear a pad if you have any discharge. Change your pad if it becomes bloody or covered in drainage. Take care of your cut from surgery as told. Make sure you: Wash your hands with soap and water for at least 20 seconds before and after you change your pad. If you can't use soap and water, use hand sanitizer. Change your pad. Leave stitches alone. Check the area around your cut every day for signs of infection. Check for: Redness, swelling, or pain. More fluid or blood. Warmth. Pus or a bad smell. General instructions Ask what things are safe for you to do at home. Ask when you can go back to work or school. Do not have vaginal sex or put anything in your vagina until your provider says it's safe. A cold pack can help with pain and swelling. Keep all follow-up visits. Your provider will check your cuts from surgery.  Monitor the incision with a mirror daily Use perineal ice packs and perineal spray bottle Your provider may give you more instructions. Make sure you know what you can and can't do. Contact a health care provider if: You have chills or a fever. You have pain even  after taking medicine. Your cut has signs of infection.  This information is not intended to replace advice given to you by your health care provider. Make sure you discuss any questions you have with your health care provider.  You may use lidocaine  gel, estrogen cream, and or neosporin on the area Document Revised: 12/23/2022 Document Reviewed: 12/23/2022 Elsevier Patient Education  2025 [employer].

## 2024-04-29 NOTE — Progress Notes (Signed)
 Pt is concerned with how long is the healing process and much she will have to pay out of pocket.

## 2024-05-18 ENCOUNTER — Ambulatory Visit (HOSPITAL_COMMUNITY): Admit: 2024-05-18 | Payer: Self-pay | Admitting: Obstetrics and Gynecology
